# Patient Record
Sex: Female | Born: 1980 | Race: Black or African American | Hispanic: No | Marital: Single | Smoking: Never smoker
Health system: Southern US, Community
[De-identification: ages and names within clinical notes are randomized; demographics above are authoritative.]

## PROBLEM LIST (undated history)

## (undated) DIAGNOSIS — G4730 Sleep apnea, unspecified: Secondary | ICD-10-CM

## (undated) DIAGNOSIS — G43909 Migraine, unspecified, not intractable, without status migrainosus: Secondary | ICD-10-CM

## (undated) HISTORY — PX: OTHER SURGICAL HISTORY: SHX169

## (undated) HISTORY — PX: ROOT CANAL: SHX2363

## (undated) HISTORY — PX: WISDOM TOOTH EXTRACTION: SHX21

---

## 2001-03-06 ENCOUNTER — Emergency Department (HOSPITAL_COMMUNITY): Admission: EM | Admit: 2001-03-06 | Discharge: 2001-03-06 | Payer: Self-pay | Admitting: Emergency Medicine

## 2015-02-04 ENCOUNTER — Encounter: Payer: Self-pay | Admitting: Emergency Medicine

## 2015-02-04 ENCOUNTER — Emergency Department
Admission: EM | Admit: 2015-02-04 | Discharge: 2015-02-04 | Disposition: A | Discharge status: Home / Self Care | Payer: No Typology Code available for payment source | Attending: Emergency Medicine | Admitting: Emergency Medicine

## 2015-02-04 DIAGNOSIS — Y9389 Activity, other specified: Secondary | ICD-10-CM | POA: Diagnosis not present

## 2015-02-04 DIAGNOSIS — Y998 Other external cause status: Secondary | ICD-10-CM | POA: Insufficient documentation

## 2015-02-04 DIAGNOSIS — Z79899 Other long term (current) drug therapy: Secondary | ICD-10-CM | POA: Diagnosis not present

## 2015-02-04 DIAGNOSIS — S199XXA Unspecified injury of neck, initial encounter: Secondary | ICD-10-CM | POA: Insufficient documentation

## 2015-02-04 DIAGNOSIS — F419 Anxiety disorder, unspecified: Secondary | ICD-10-CM | POA: Insufficient documentation

## 2015-02-04 DIAGNOSIS — S299XXA Unspecified injury of thorax, initial encounter: Secondary | ICD-10-CM | POA: Diagnosis not present

## 2015-02-04 DIAGNOSIS — Y92410 Unspecified street and highway as the place of occurrence of the external cause: Secondary | ICD-10-CM | POA: Insufficient documentation

## 2015-02-04 HISTORY — DX: Sleep apnea, unspecified: G47.30

## 2015-02-04 HISTORY — DX: Migraine, unspecified, not intractable, without status migrainosus: G43.909

## 2015-02-04 MED ORDER — TRAMADOL HCL 50 MG PO TABS
ORAL_TABLET | ORAL | Status: AC
  Administered 2015-02-04: 100 mg via ORAL
  Filled 2015-02-04: qty 2

## 2015-02-04 MED ORDER — ACETAMINOPHEN 500 MG PO TABS: 1000.0000 mg | ORAL_TABLET | Freq: Once | ORAL | Status: DC

## 2015-02-04 MED ORDER — TRAMADOL HCL 50 MG PO TABS
100.0000 mg | ORAL_TABLET | Freq: Once | ORAL | Status: AC
  Administered 2015-02-04: 100 mg via ORAL

## 2015-02-04 MED ORDER — TRAMADOL HCL 50 MG PO TABS: 50.0000 mg | ORAL_TABLET | Freq: Two times a day (BID) | ORAL | Status: DC

## 2015-02-04 MED ORDER — ACETAMINOPHEN 500 MG PO TABS
ORAL_TABLET | ORAL | Status: AC
  Filled 2015-02-04: qty 2

## 2015-02-04 MED ORDER — ONDANSETRON 4 MG PO TBDP
4.0000 mg | ORAL_TABLET | Freq: Once | ORAL | Status: AC
  Administered 2015-02-04: 4 mg via ORAL

## 2015-02-04 MED ORDER — ONDANSETRON 4 MG PO TBDP
ORAL_TABLET | ORAL | Status: AC
  Administered 2015-02-04: 4 mg via ORAL
  Filled 2015-02-04: qty 4
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
## 2015-02-27 ENCOUNTER — Encounter: Payer: Self-pay | Admitting: *Deleted

## 2015-02-27 ENCOUNTER — Emergency Department: Payer: No Typology Code available for payment source

## 2015-02-27 ENCOUNTER — Emergency Department
Admission: EM | Admit: 2015-02-27 | Discharge: 2015-02-27 | Disposition: A | Discharge status: Home / Self Care | Payer: No Typology Code available for payment source | Attending: Student | Admitting: Student

## 2015-02-27 DIAGNOSIS — Y9389 Activity, other specified: Secondary | ICD-10-CM | POA: Insufficient documentation

## 2015-02-27 DIAGNOSIS — Y92410 Unspecified street and highway as the place of occurrence of the external cause: Secondary | ICD-10-CM | POA: Insufficient documentation

## 2015-02-27 DIAGNOSIS — S299XXA Unspecified injury of thorax, initial encounter: Secondary | ICD-10-CM | POA: Diagnosis not present

## 2015-02-27 DIAGNOSIS — S3992XA Unspecified injury of lower back, initial encounter: Secondary | ICD-10-CM | POA: Insufficient documentation

## 2015-02-27 DIAGNOSIS — Y998 Other external cause status: Secondary | ICD-10-CM | POA: Diagnosis not present

## 2015-02-27 DIAGNOSIS — S0990XA Unspecified injury of head, initial encounter: Secondary | ICD-10-CM | POA: Diagnosis not present

## 2015-02-27 DIAGNOSIS — T148XXA Other injury of unspecified body region, initial encounter: Secondary | ICD-10-CM

## 2015-02-27 DIAGNOSIS — Z3202 Encounter for pregnancy test, result negative: Secondary | ICD-10-CM | POA: Insufficient documentation

## 2015-02-27 DIAGNOSIS — Z79899 Other long term (current) drug therapy: Secondary | ICD-10-CM | POA: Diagnosis not present

## 2015-02-27 DIAGNOSIS — S199XXA Unspecified injury of neck, initial encounter: Secondary | ICD-10-CM | POA: Diagnosis present

## 2015-02-27 LAB — POCT PREGNANCY, URINE: PREG TEST UR: NEGATIVE

## 2015-02-27 MED ORDER — ETODOLAC 400 MG PO TABS: 400.0000 mg | ORAL_TABLET | Freq: Two times a day (BID) | ORAL | Status: DC

## 2015-02-27 MED ORDER — CYCLOBENZAPRINE HCL 5 MG PO TABS: 5.0000 mg | ORAL_TABLET | Freq: Three times a day (TID) | ORAL | Status: DC | PRN

## 2015-02-27 MED ORDER — KETOROLAC TROMETHAMINE 60 MG/2ML IM SOLN
60.0000 mg | Freq: Once | INTRAMUSCULAR | Status: AC
  Administered 2015-02-27: 60 mg via INTRAMUSCULAR
  Filled 2015-02-27: qty 2

## 2015-02-27 MED ORDER — PROMETHAZINE HCL 25 MG/ML IJ SOLN
25.0000 mg | Freq: Once | INTRAMUSCULAR | Status: AC
  Administered 2015-02-27: 25 mg via INTRAMUSCULAR
  Filled 2015-02-27: qty 1
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
## 2015-05-03 ENCOUNTER — Emergency Department
Admission: EM | Admit: 2015-05-03 | Discharge: 2015-05-03 | Disposition: A | Discharge status: Home / Self Care | Payer: 59 | Attending: Emergency Medicine | Admitting: Emergency Medicine

## 2015-05-03 ENCOUNTER — Encounter: Payer: Self-pay | Admitting: Emergency Medicine

## 2015-05-03 DIAGNOSIS — X58XXXA Exposure to other specified factors, initial encounter: Secondary | ICD-10-CM | POA: Insufficient documentation

## 2015-05-03 DIAGNOSIS — Y998 Other external cause status: Secondary | ICD-10-CM | POA: Diagnosis not present

## 2015-05-03 DIAGNOSIS — Z791 Long term (current) use of non-steroidal anti-inflammatories (NSAID): Secondary | ICD-10-CM | POA: Insufficient documentation

## 2015-05-03 DIAGNOSIS — T7840XA Allergy, unspecified, initial encounter: Secondary | ICD-10-CM | POA: Diagnosis not present

## 2015-05-03 DIAGNOSIS — Y9289 Other specified places as the place of occurrence of the external cause: Secondary | ICD-10-CM | POA: Insufficient documentation

## 2015-05-03 DIAGNOSIS — Z79899 Other long term (current) drug therapy: Secondary | ICD-10-CM | POA: Insufficient documentation

## 2015-05-03 DIAGNOSIS — Y9389 Activity, other specified: Secondary | ICD-10-CM | POA: Insufficient documentation

## 2015-05-03 MED ORDER — HYDROXYZINE HCL 25 MG PO TABS
ORAL_TABLET | ORAL | Status: AC
  Administered 2015-05-03: 25 mg via ORAL
  Filled 2015-05-03: qty 1

## 2015-05-03 MED ORDER — FAMOTIDINE IN NACL 20-0.9 MG/50ML-% IV SOLN
20.0000 mg | Freq: Once | INTRAVENOUS | Status: AC
  Administered 2015-05-03: 20 mg via INTRAVENOUS
  Filled 2015-05-03: qty 50

## 2015-05-03 MED ORDER — METHYLPREDNISOLONE SODIUM SUCC 125 MG IJ SOLR
125.0000 mg | Freq: Once | INTRAMUSCULAR | Status: AC
  Administered 2015-05-03: 125 mg via INTRAVENOUS
  Filled 2015-05-03: qty 2

## 2015-05-03 MED ORDER — HYDROXYZINE HCL 25 MG PO TABS
25.0000 mg | ORAL_TABLET | Freq: Once | ORAL | Status: AC
  Administered 2015-05-03: 25 mg via ORAL

## 2015-05-03 MED ORDER — DIPHENHYDRAMINE HCL 50 MG/ML IJ SOLN
50.0000 mg | Freq: Once | INTRAMUSCULAR | Status: AC
  Administered 2015-05-03: 50 mg via INTRAVENOUS
  Filled 2015-05-03: qty 1

## 2015-05-03 MED ORDER — HYDROXYZINE HCL 25 MG PO TABS: 25.0000 mg | ORAL_TABLET | Freq: Three times a day (TID) | ORAL | Status: DC | PRN

## 2015-05-03 MED ORDER — SODIUM CHLORIDE 0.9 % IV BOLUS (SEPSIS)
1000.0000 mL | Freq: Once | INTRAVENOUS | Status: AC
Start: 2015-05-03 — End: 2015-05-03
  Administered 2015-05-03: 1000 mL via INTRAVENOUS
  Filled 2015-05-03: qty 1000

## 2015-05-03 MED ORDER — IPRATROPIUM-ALBUTEROL 0.5-2.5 (3) MG/3ML IN SOLN
3.0000 mL | Freq: Once | RESPIRATORY_TRACT | Status: AC
  Administered 2015-05-03: 3 mL via RESPIRATORY_TRACT
  Filled 2015-05-03: qty 3

## 2015-05-03 MED ORDER — PREDNISONE 20 MG PO TABS: 40.0000 mg | ORAL_TABLET | Freq: Every day | ORAL | Status: DC

## 2015-05-03 NOTE — ED Notes (Signed)
No swelling noted to oropharnyx. No dermatological rash noted. VSS. NAD noted.

## 2015-05-03 NOTE — Discharge Instructions (Signed)
Allergies  Allergies may happen from anything your body is sensitive to. This may be food, medicines, pollens, chemicals, and many other things. Food allergies can be severe and deadly.  HOME CARE  If you do not know what causes a reaction, keep a diary. Write down the foods you ate and the symptoms that followed. Avoid foods that cause reactions.  If you have red raised spots (hives) or a rash:  Take medicine as told by your doctor.  Use medicines for red raised spots and itching as needed.  Apply cold cloths (compresses) to the skin. Take a cool bath. Avoid hot baths or showers.  If you are severely allergic:  It is often necessary to go to the hospital after you have treated your reaction.  Wear your medical alert jewelry.  You and your family must learn how to give a allergy shot or use an allergy kit (anaphylaxis kit).  Always carry your allergy kit or shot with you. Use this medicine as told by your doctor if a severe reaction is occurring. GET HELP RIGHT AWAY IF:  You have trouble breathing or are making high-pitched whistling sounds (wheezing).  You have a tight feeling in your chest or throat.  You have a puffy (swollen) mouth.  You have red raised spots, puffiness (swelling), or itching all over your body.  You have had a severe reaction that was helped by your allergy kit or shot. The reaction can return once the medicine has worn off.  You think you are having a food allergy. Symptoms most often happen within 30 minutes of eating a food.  Your symptoms have not gone away within 2 days or are getting worse.  You have new symptoms.  You want to retest yourself with a food or drink you think causes an allergic reaction. Only do this under the care of a doctor. MAKE SURE YOU:   Understand these instructions.  Will watch your condition.  Will get help right away if you are not doing well or get worse. Document Released: 11/30/2012 Document Reviewed:  11/30/2012 [facility] Patient Information 2015 [place]. This information is not intended to replace advice given to you by your health care provider. Make sure you discuss any questions you have with your health care provider.
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
## 2015-05-03 NOTE — ED Provider Notes (Signed)
[facility] Emergency Department Provider Note  Time seen: 12:30 PM  I have reviewed the triage vital signs and the nursing notes.   HISTORY  Chief Complaint Allergic Reaction    HPI Kelli Mayer is a 34 y.o. female with a past medical history of migraines and sleep apnea presents the emergency department a possible allergic reaction. According to the patient she was at the beach over the weekend, returned on Monday, beginning Monday evening she began with diffuse itching. Denies any known exposures, but states it was a new environment so she might of been exposed to something she is allergic to. Last night she said the itching worsened and she felt some discomfort in her throat so she took Benadryl at 7 PM, and again at 11 PM. She slept overnight, but this morning she felt that the itching was worse as well as some tongue swelling so she came to the emergency department for evaluation. 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 Currently the patient states some tongue swelling and itching diffusely, but is in no distress, speaking without difficulty, and swallowing secretions without any issues.     Past Medical History  Diagnosis Date  . Migraines   . Sleep apnea     There are no active problems to display for this patient.   History reviewed. No pertinent past surgical history.  Current Outpatient Rx  Name  Route  Sig  Dispense  Refill  . cyclobenzaprine (FLEXERIL) 5 MG tablet   Oral   Take 1 tablet (5 mg total) by mouth every 8 (eight) hours as needed for muscle spasms.   30 tablet   0   . EPINEPHrine 0.3 mg/0.3 mL IJ SOAJ injection   Intramuscular   Inject into the muscle once.         . etodolac (LODINE) 400 MG tablet   Oral   Take 1 tablet (400 mg total) by mouth 2 (two) times daily.   60 tablet   0   . traMADol (ULTRAM) 50 MG tablet   Oral   Take 1  tablet (50 mg total) by mouth 2 (two) times daily.   10 tablet   0     Allergies Coconut fatty acids; Ibuprofen; Iron; Naproxen; and Percocet  History reviewed. No pertinent family history.  Social History Social History  Substance Use Topics  . Smoking status: Never Smoker   . Smokeless tobacco: Never Used  . Alcohol Use: Yes     Comment: occassionally    Review of Systems Constitutional: Negative for fever. ENT: Mild tongue swelling per patient. Cardiovascular: Negative for chest pain. Respiratory: Negative for shortness of breath. Gastrointestinal: Negative for abdominal pain. Denies nausea or vomiting. Skin: States some bumps diffusely especially of her extremities and torso. Neurological: Negative for headache 10-point ROS otherwise negative.  ____________________________________________   PHYSICAL EXAM:  VITAL SIGNS: ED Triage Vitals  Enc Vitals Group     BP 05/03/15 1157 131/74 mmHg     Pulse Rate 05/03/15 1157 102     Resp 05/03/15 1157 18     Temp 05/03/15 1157 98.4 F (36.9 C)     Temp Source 05/03/15 1157 Oral     SpO2 05/03/15 1157 100 %     Weight 05/03/15 1157 264 lb (119.75 kg)     Height 05/03/15 1157 5\' 2"  (1.575 m)     Head Cir --  Peak Flow --      Pain Score 05/03/15 1157 0     Pain Loc --      Pain Edu? --      Excl. in [place]? --     Constitutional: Alert and oriented. Well appearing and in no distress. Eyes: Normal exam ENT   Mouth/Throat: Mucous membranes are moist. No edema of the tongue or pharynx identified. Cardiovascular: Normal rate, regular rhythm. No murmur Respiratory: Normal respiratory effort without tachypnea nor retractions. Breath sounds are clear. No wheezes on exam. Gastrointestinal: Soft and nontender. No distention.   Musculoskeletal: Nontender with normal range of motion in all extremities.  Neurologic:  Normal speech and language. No gross focal neurologic deficits are appreciated. Speech is normal. Skin:   Skin is warm, dry. Patient does have mild hives to lower extremity, and 1 on her face. Psychiatric: Mood and affect are normal. Speech and behavior are normal.   ____________________________________________    INITIAL IMPRESSION / ASSESSMENT AND PLAN / ED COURSE  Pertinent labs & imaging results that were available during my care of the patient were reviewed by me and considered in my medical decision making (see chart for details).  Patient with possible allergic reaction. No oral pharyngeal swelling identified. Patient speaking and swallowing without difficulty. We'll dose steroids, Benadryl, Pepcid, IV fluids, and closely monitor in the emergency department.  Patient states she feels much better, denies any throat or oral complaints. She is asking to be discharged home. She states she continues have some mild itching we will try Atarax, discharge from prednisone with a prescription for Atarax as well. Patient agreeable to plan. Discussed very strict allergic return precautions. Patient agreeable.  ____________________________________________   FINAL CLINICAL IMPRESSION(S) / ED DIAGNOSES  Allergic reaction   Harvest Dark, MD 05/03/15 1515

## 2015-06-27 ENCOUNTER — Ambulatory Visit: Payer: Self-pay | Admitting: Family Medicine

## 2015-11-21 ENCOUNTER — Encounter (HOSPITAL_COMMUNITY): Payer: Self-pay | Admitting: *Deleted

## 2015-11-21 ENCOUNTER — Inpatient Hospital Stay (HOSPITAL_COMMUNITY)
Admission: AD | Admit: 2015-11-21 | Discharge: 2015-11-21 | Disposition: A | Discharge status: Home / Self Care | Payer: BLUE CROSS/BLUE SHIELD | Source: Ambulatory Visit | Attending: Obstetrics and Gynecology | Admitting: Obstetrics and Gynecology

## 2015-11-21 DIAGNOSIS — Z885 Allergy status to narcotic agent status: Secondary | ICD-10-CM | POA: Insufficient documentation

## 2015-11-21 DIAGNOSIS — Z886 Allergy status to analgesic agent status: Secondary | ICD-10-CM | POA: Insufficient documentation

## 2015-11-21 DIAGNOSIS — Z91048 Other nonmedicinal substance allergy status: Secondary | ICD-10-CM | POA: Insufficient documentation

## 2015-11-21 DIAGNOSIS — D500 Iron deficiency anemia secondary to blood loss (chronic): Secondary | ICD-10-CM | POA: Diagnosis not present

## 2015-11-21 DIAGNOSIS — Z79899 Other long term (current) drug therapy: Secondary | ICD-10-CM | POA: Insufficient documentation

## 2015-11-21 DIAGNOSIS — N920 Excessive and frequent menstruation with regular cycle: Secondary | ICD-10-CM | POA: Diagnosis not present

## 2015-11-21 DIAGNOSIS — Z975 Presence of (intrauterine) contraceptive device: Secondary | ICD-10-CM | POA: Insufficient documentation

## 2015-11-21 DIAGNOSIS — R5383 Other fatigue: Secondary | ICD-10-CM | POA: Insufficient documentation

## 2015-11-21 DIAGNOSIS — D62 Acute posthemorrhagic anemia: Secondary | ICD-10-CM

## 2015-11-21 DIAGNOSIS — N939 Abnormal uterine and vaginal bleeding, unspecified: Secondary | ICD-10-CM | POA: Diagnosis present

## 2015-11-21 DIAGNOSIS — Z79891 Long term (current) use of opiate analgesic: Secondary | ICD-10-CM | POA: Diagnosis not present

## 2015-11-21 DIAGNOSIS — G4730 Sleep apnea, unspecified: Secondary | ICD-10-CM | POA: Insufficient documentation

## 2015-11-21 DIAGNOSIS — N924 Excessive bleeding in the premenopausal period: Secondary | ICD-10-CM

## 2015-11-21 DIAGNOSIS — Z7952 Long term (current) use of systemic steroids: Secondary | ICD-10-CM | POA: Insufficient documentation

## 2015-11-21 DIAGNOSIS — R42 Dizziness and giddiness: Secondary | ICD-10-CM | POA: Insufficient documentation

## 2015-11-21 LAB — CBC
HEMATOCRIT: 26.4 % — AB (ref 36.0–46.0)
Hemoglobin: 8.1 g/dL — ABNORMAL LOW (ref 12.0–15.0)
MCH: 23 pg — AB (ref 26.0–34.0)
MCHC: 30.7 g/dL (ref 30.0–36.0)
MCV: 75 fL — ABNORMAL LOW (ref 78.0–100.0)
PLATELETS: 310 10*3/uL (ref 150–400)
RBC: 3.52 MIL/uL — ABNORMAL LOW (ref 3.87–5.11)
RDW: 19.3 % — AB (ref 11.5–15.5)
WBC: 5.6 10*3/uL (ref 4.0–10.5)

## 2015-11-21 LAB — TYPE AND SCREEN
ABO/RH(D): O POS
Antibody Screen: NEGATIVE

## 2015-11-21 LAB — HCG, SERUM, QUALITATIVE: Preg, Serum: NEGATIVE

## 2015-11-21 LAB — FERRITIN: Ferritin: 5 ng/mL — ABNORMAL LOW (ref 11–307)

## 2015-11-21 LAB — ABO/RH: ABO/RH(D): O POS

## 2015-11-21 MED ORDER — SODIUM CHLORIDE 0.9 % IV SOLN
510.0000 mg | Freq: Once | INTRAVENOUS | Status: AC
  Administered 2015-11-21: 510 mg via INTRAVENOUS
  Filled 2015-11-21: qty 17

## 2015-11-21 MED ORDER — NORETHINDRONE ACETATE 5 MG PO TABS: 5.0000 mg | ORAL_TABLET | Freq: Every day | ORAL | Status: AC

## 2015-11-21 MED ORDER — SODIUM CHLORIDE 0.9 % IV SOLN
INTRAVENOUS | Status: DC
  Administered 2015-11-21: 20:00:00 via INTRAVENOUS

## 2015-11-21 MED ORDER — DIPHENHYDRAMINE HCL 50 MG/ML IJ SOLN
25.0000 mg | Freq: Once | INTRAMUSCULAR | Status: AC
  Administered 2015-11-21: 25 mg via INTRAVENOUS
  Filled 2015-11-21: qty 1

## 2015-11-21 NOTE — MAU Note (Signed)
Patient sent from office for evaluation of heavy bleeding.

## 2015-11-21 NOTE — MAU Note (Signed)
Pt tolerated IV Fereheme well. C/O some itching on the roof of her mouth. Pt refused further V/S pulse ox between 96-99% on R/A

## 2015-11-21 NOTE — MAU Provider Note (Signed)
Chief Complaint:  Vaginal Bleeding   First Provider Initiated Contact with Patient 11/21/15 1846     HPI  Kelli Mayer is a 35 y.o. G3P0030 who presents to maternity admissions reporting vaginal bleeding with symptomatic anemia.  Had a mirena placed today but was feeling weak and fingerstick Hgb was 7.  She was sent here for repeat CBC and possible transfusion. She reports vaginal bleeding, no vaginal itching/burning, urinary symptoms, h/a, dizziness, n/v, or fever/chills.    Dr Nena Alexander Note: Kelli Mayer is an 35 y.o. female G70P0030 Non-pregnant female presenting for severe anemia from ongoing issue with menorrhagia. Pt has had heavy menses for years and was on OCP's until last Fall then d/c'd because did not like how she felt on them. Since then menses have become increasingly worse, q month, x3-8d, heavy, crampy with clots. At her annual exam 10/27/15 was noted to have a hemoglobin of 9.2 on CBC. An US showed possible adenomyosis. She was presented with options and elected to try a Mirena to manage the bleeding. She presented today and had Mirena placed in the office. 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 Hgb on FS was 7.2 and bleeding noted to be moderate. She denies any recent sexual activity, boyfriend lives in [place]. STD screening done in office 10/27/15 and negative. She was sent to hospital for formal CBC and possible transfusion given symptomatic anemia and bleeding.   Past Medical History: Past Medical History  Diagnosis Date  . Migraines   . Sleep apnea     Past obstetric history: OB History  Gravida Para Term Preterm AB SAB TAB Ectopic Multiple Living  3 0   3 3    0    # Outcome Date GA Lbr Len/2nd Weight Sex Delivery Anes PTL Lv  3 SAB           2 SAB           1 SAB               Past Surgical History: Past Surgical History  Procedure  Laterality Date  . Laprascopic    . Root canal    . Wisdom tooth extraction      Family History: History reviewed. No pertinent family history.  Social History: Social History  Substance Use Topics  . Smoking status: Never Smoker   . Smokeless tobacco: Never Used  . Alcohol Use: Yes     Comment: occassionally    Allergies:  Allergies  Allergen Reactions  . Coconut Fatty Acids Anaphylaxis  . Ibuprofen Hives and Swelling    Swelling is of the tongue.  . Iron Hives, Itching and Swelling    Swelling is of the tongue, itching occurs in face and mouth. Has not had Feraheme  . Naproxen Hives and Other (See Comments)    Causes blisters.  . Oxycodone Itching and Rash    Meds:  Prescriptions prior to admission  Medication Sig Dispense Refill Last Dose  . albuterol (PROVENTIL HFA;VENTOLIN HFA) 108 (90 Base) MCG/ACT inhaler Inhale 2 puffs into the lungs every 4 (four) hours as needed for wheezing or shortness of breath.   Past Week at Unknown time  . chlorhexidine (PERIDEX) 0.12 % solution 15 mLs by Mouth Rinse route 2 (two) times daily.  1 11/21/2015 at Unknown time  . clonazePAM (KLONOPIN) 0.5 MG tablet Take 0.5 mg by mouth at bedtime as needed (For sleep.).  1 Past Month at Unknown time  . Cyanocobalamin (B-12 PO) Place 1 mL under the tongue as needed (For energy.).   Past Week at Unknown time  . fexofenadine-pseudoephedrine (ALLEGRA-D) 60-120 MG 12 hr tablet Take 1 tablet by mouth 2 (two) times daily as needed (For allergies.).   11/20/2015 at Unknown time  . ipratropium-albuterol (DUONEB) 0.5-2.5 (3) MG/3ML SOLN Take 3 mLs by nebulization every 4 (four) hours as needed (For asthma.).   Past Week at Unknown time  . Ketotifen Fumarate (ALLERGY EYE DROPS OP) Place 2 drops into both eyes 4 (four) times daily as needed (For allergies.).   Past Week at Unknown time  . levonorgestrel (MIRENA) 20 MCG/24HR IUD 1 each by Intrauterine route once. Placed 11/21/15.   Continuous  . Multiple  Vitamins-Minerals (EMERGEN-C IMMUNE PO) Take 1 packet by mouth 2 (two) times a week.   Past Week at Unknown time  . EPINEPHrine 0.3 mg/0.3 mL IJ SOAJ injection Inject into the muscle once.   Emergency    I have reviewed patient's Past Medical Hx, Surgical Hx, Family Hx, Social Hx, medications and allergies.  ROS:  Review of Systems  Constitutional: Positive for fatigue. Negative for fever and chills.  Respiratory: Negative for shortness of breath.   Gastrointestinal: Negative for nausea, vomiting, abdominal pain, diarrhea and constipation.  Genitourinary: Positive for vaginal bleeding. Negative for vaginal discharge, difficulty urinating and pelvic pain.  Musculoskeletal: Negative for back pain.  Neurological: Positive for dizziness.   Other systems negative  Physical Exam  Patient Vitals for the past 24 hrs:  BP Temp Temp src Pulse Resp  11/21/15 1831 143/90 mmHg 98.2 F (36.8 C) Oral 71 16   Constitutional: Well-developed, well-nourished female in no acute distress.  Cardiovascular: normal rate and rhythm, no ectopy audible, S1 & S2 heard, no murmur Respiratory: normal effort, no distress. Lungs CTAB with no wheezes or crackles GI: Abd soft, non-tender.  Nondistended.  No rebound, No guarding.  Bowel Sounds audible  MS: Extremities nontender, no edema, normal ROM Neurologic: Alert and oriented x 4.   Grossly nonfocal. GU: Neg CVAT. Skin:  Warm and Dry Psych:  Affect appropriate.  Pelvic exam deferred due to recent one done today   Labs: Results for orders placed or performed during the hospital encounter of 11/21/15 (from the past 24 hour(s))  CBC     Status: Abnormal   Collection Time: 11/21/15  6:23 PM  Result Value Ref Range   WBC 5.6 4.0 - 10.5 K/uL   RBC 3.52 (L) 3.87 - 5.11 MIL/uL   Hemoglobin 8.1 (L) 12.0 - 15.0 g/dL   HCT 26.4 (L) 36.0 - 46.0 %   MCV 75.0 (L) 78.0 - 100.0 fL   MCH 23.0 (L) 26.0 - 34.0 pg   MCHC 30.7 30.0 - 36.0 g/dL   RDW 19.3 (H) 11.5 - 15.5  %   Platelets 310 150 - 400 K/uL  Type and screen [place]     Status: None   Collection Time: 11/21/15  6:23 PM  Result Value Ref Range   ABO/RH(D) O POS    Antibody Screen NEG    Sample Expiration 11/24/2015   hCG, serum, qualitative     Status: None   Collection Time: 11/21/15  6:23 PM  Result Value Ref Range   Preg, Serum NEGATIVE NEGATIVE   --/--/O POS (04/04 1823)  Imaging:  No results found.  MAU Course/MDM: I have ordered labs as follows:  CBC Imaging ordered: none  Results reviewed.   Consult Dr Marvel Plan.   Treatments in MAU included Fereheme infusion with Benadryl premedication..   Discussed hemoglobin level was better than expected. Offered transfusion but explained that Dr Marvel Plan felt it would be fine to just give [facility].  Patient agrees  Assessment: Menorrhagia Anemia, blood loss  Plan: Discharge home Recommend iron rich foods, po hydration Rx sent for Aygestin for control of bleeding  (5mg   Daily x 30 days) Follow up in office in 2 weeks     Medication List    TAKE these medications        norethindrone 5 MG tablet  Commonly known as:  AYGESTIN  Take 1 tablet (5 mg total) by mouth daily.      ASK your doctor about these medications        albuterol 108 (90 Base) MCG/ACT inhaler  Commonly known as:  PROVENTIL HFA;VENTOLIN HFA  Inhale 2 puffs into the lungs every 4 (four) hours as needed for wheezing or shortness of breath.     ALLERGY EYE DROPS OP  Place 2 drops into both eyes 4 (four) times daily as needed (For allergies.).     B-12 PO  Place 1 mL under the tongue as needed (For energy.).     chlorhexidine 0.12 % solution  Commonly known as:  PERIDEX  15 mLs by Mouth Rinse route 2 (two) times daily.     clonazePAM 0.5 MG tablet  Commonly known as:  KLONOPIN  Take 0.5 mg by mouth at bedtime as needed (For sleep.).     EMERGEN-C IMMUNE PO  Take 1 packet by mouth 2 (two) times a week.     EPINEPHrine 0.3  mg/0.3 mL Soaj injection  Commonly known as:  EPI-PEN  Inject into the muscle once.     fexofenadine-pseudoephedrine 60-120 MG 12 hr tablet  Commonly known as:  ALLEGRA-D  Take 1 tablet by mouth 2 (two) times daily as needed (For allergies.).     ipratropium-albuterol 0.5-2.5 (3) MG/3ML Soln  Commonly known as:  DUONEB  Take 3 mLs by nebulization every 4 (four) hours as needed (For asthma.).     levonorgestrel 20 MCG/24HR IUD  Commonly known as:  MIRENA  1 each by Intrauterine route once. Placed 11/21/15.       Pt stable at time of discharge.  Encouraged to return here or to other Urgent Care/ED if she develops worsening of symptoms, increase in pain, fever, or other concerning symptoms.   Hansel Feinstein CNM, MSN Certified Nurse-Midwife 11/21/2015 7:37 PM

## 2015-11-21 NOTE — Discharge Instructions (Signed)

## 2015-11-21 NOTE — H&P (Signed)
Kelli Mayer is an 35 y.o. female G27P0030  Non-pregnant female presenting for severe anemia from ongoing issue with menorrhagia.  Pt has had heavy menses for years and was on OCP's until last Fall then d/c'd because did not like how she felt on them.  Since then menses have become increasingly worse, q month, x3-8d, heavy, crampy with clots.  At her annual exam 10/27/15 was noted to have a hemoglobin of 9.2 on CBC.  An US showed possible adenomyosis. She was presented with options and elected to try a Mirena to manage the bleeding.  She presented today and had Mirena placed in the office.  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  Hgb on FS was 7.2 and bleeding noted to be moderate.  She denies any recent sexual activity, boyfriend lives in [place].  STD screening done in office 10/27/15 and negative.  She was sent to hospital for formal CBC and possible transfusion given symptomatic anemia and bleeding.   Pertinent Gynecological History: Menses: q month with excessive flow and clotting  OB History: SAB x 2 EAB x1   Menstrual History: LMP current    Past Medical History  Diagnosis Date  . Migraines   . Sleep apnea     No past surgical history on file.  No family history on file.  Social History:  reports that she has never smoked. She has never used smokeless tobacco. She reports that she drinks alcohol. She reports that she does not use illicit drugs.  Allergies:  Allergies  Allergen Reactions  . Coconut Fatty Acids   . Ibuprofen   . Iron   . Naproxen   . Percocet [Oxycodone-Acetaminophen]     Prescriptions prior to admission  Medication Sig Dispense Refill Last Dose  . cyclobenzaprine (FLEXERIL) 5 MG tablet Take 1 tablet (5 mg total) by mouth every 8 (eight) hours as needed for muscle spasms. 30 tablet 0   . EPINEPHrine 0.3 mg/0.3 mL IJ SOAJ injection Inject into the muscle  once.     . etodolac (LODINE) 400 MG tablet Take 1 tablet (400 mg total) by mouth 2 (two) times daily. 60 tablet 0   . hydrOXYzine (ATARAX/VISTARIL) 25 MG tablet Take 1 tablet (25 mg total) by mouth 3 (three) times daily as needed. 30 tablet 0   . predniSONE (DELTASONE) 20 MG tablet Take 2 tablets (40 mg total) by mouth daily. 10 tablet 0   . traMADol (ULTRAM) 50 MG tablet Take 1 tablet (50 mg total) by mouth 2 (two) times daily. 10 tablet 0     Review of Systems  Constitutional: Positive for malaise/fatigue. Negative for fever.  Gastrointestinal: Negative for abdominal pain.  Neurological: Positive for weakness.    There were no vitals taken for this visit. Physical Exam  Constitutional:  Appears fatigued  Cardiovascular: Normal rate and regular rhythm.   Respiratory: Effort normal.  GI: Soft.  Genitourinary: Vagina normal.  Uterus 9 weeks size IUD in place    No results found for this or any previous visit (from the past 24 hour(s)).  No results found.  Assessment/Plan: Will await results of CBC and if hemoglobin confirmed <8 will transfuse 2 units.  Pt has IUD in place to help with future bleeding, she does not want any procedure which would alter her fertility.  Logan Bores 11/21/2015, 6:03 PM

## 2016-12-04 ENCOUNTER — Ambulatory Visit: Payer: BLUE CROSS/BLUE SHIELD | Admitting: Obstetrics

## 2016-12-20 ENCOUNTER — Ambulatory Visit (INDEPENDENT_AMBULATORY_CARE_PROVIDER_SITE_OTHER): Payer: Managed Care, Other (non HMO) | Admitting: Obstetrics

## 2016-12-20 ENCOUNTER — Encounter: Payer: Self-pay | Admitting: Obstetrics

## 2016-12-20 VITALS — BP 130/94 | HR 80 | Ht 62.0 in | Wt 251.0 lb

## 2016-12-20 DIAGNOSIS — Z1151 Encounter for screening for human papillomavirus (HPV): Secondary | ICD-10-CM

## 2016-12-20 DIAGNOSIS — Z124 Encounter for screening for malignant neoplasm of cervix: Secondary | ICD-10-CM | POA: Diagnosis not present

## 2016-12-20 DIAGNOSIS — Z3202 Encounter for pregnancy test, result negative: Secondary | ICD-10-CM | POA: Diagnosis not present

## 2016-12-20 DIAGNOSIS — Z3169 Encounter for other general counseling and advice on procreation: Secondary | ICD-10-CM

## 2016-12-20 DIAGNOSIS — R110 Nausea: Secondary | ICD-10-CM

## 2016-12-20 DIAGNOSIS — T8332XA Displacement of intrauterine contraceptive device, initial encounter: Secondary | ICD-10-CM

## 2016-12-20 DIAGNOSIS — Z01419 Encounter for gynecological examination (general) (routine) without abnormal findings: Secondary | ICD-10-CM

## 2016-12-20 DIAGNOSIS — N939 Abnormal uterine and vaginal bleeding, unspecified: Secondary | ICD-10-CM

## 2016-12-20 LAB — POCT URINE PREGNANCY: Preg Test, Ur: NEGATIVE

## 2016-12-20 MED ORDER — PROMETHAZINE HCL 25 MG PO TABS: 25.0000 mg | ORAL_TABLET | Freq: Four times a day (QID) | ORAL | 1 refills | Status: AC | PRN

## 2016-12-20 MED ORDER — FOLIC ACID 1 MG PO TABS: 1.0000 mg | ORAL_TABLET | Freq: Every day | ORAL | 11 refills | Status: DC

## 2016-12-20 NOTE — Patient Instructions (Addendum)
Health Maintenance, Female Adopting a healthy lifestyle and getting preventive care can go a long way to promote health and wellness. Talk with your health care provider about what schedule of regular examinations is right for you. This is a good chance for you to check in with your provider about disease prevention and staying healthy. In between checkups, there are plenty of things you can do on your own. Experts have done a lot of research about which lifestyle changes and preventive measures are most likely to keep you healthy. Ask your health care provider for more information. Weight and diet Eat a healthy diet  Be sure to include plenty of vegetables, fruits, low-fat dairy products, and lean protein.  Do not eat a lot of foods high in solid fats, added sugars, or salt.  Get regular exercise. This is one of the most important things you can do for your health.  Most adults should exercise for at least 150 minutes each week. The exercise should increase your heart rate and make you sweat (moderate-intensity exercise).  Most adults should also do strengthening exercises at least twice a week. This is in addition to the moderate-intensity exercise. Maintain a healthy weight  Body mass index (BMI) is a measurement that can be used to identify possible weight problems. It estimates body fat based on height and weight. Your health care provider can help determine your BMI and help you achieve or maintain a healthy weight.  For females 76 years of age and older:  A BMI below 18.5 is considered underweight.  A BMI of 18.5 to 24.9 is normal.  A BMI of 25 to 29.9 is considered overweight.  A BMI of 30 and above is considered obese. Watch levels of cholesterol and blood lipids  You should start having your blood tested for lipids and cholesterol at 38 years of age, then have this test every 5 years.  You may need to have your cholesterol levels checked more often if:  Your lipid or  cholesterol levels are high.  You are older than 38 years of age.  You are at high risk for heart disease. Cancer screening Lung Cancer  Lung cancer screening is recommended for adults 64-42 years old who are at high risk for lung cancer because of a history of smoking.  A yearly low-dose CT scan of the lungs is recommended for people who:  Currently smoke.  Have quit within the past 15 years.  Have at least a 30-pack-year history of smoking. A pack year is smoking an average of one pack of cigarettes a day for 1 year.  Yearly screening should continue until it has been 15 years since you quit.  Yearly screening should stop if you develop a health problem that would prevent you from having lung cancer treatment. Breast Cancer  Practice breast self-awareness. This means understanding how your breasts normally appear and feel.  It also means doing regular breast self-exams. Let your health care provider know about any changes, no matter how small.  If you are in your 20s or 30s, you should have a clinical breast exam (CBE) by a health care provider every 1-3 years as part of a regular health exam.  If you are 34 or older, have a CBE every year. Also consider having a breast X-ray (mammogram) every year.  If you have a family history of breast cancer, talk to your health care provider about genetic screening.  If you are at high risk for breast cancer, talk  to your health care provider about having an MRI and a mammogram every year.  Breast cancer gene (BRCA) assessment is recommended for women who have family members with BRCA-related cancers. BRCA-related cancers include:  Breast.  Ovarian.  Tubal.  Peritoneal cancers.  Results of the assessment will determine the need for genetic counseling and BRCA1 and BRCA2 testing. Cervical Cancer  Your health care provider may recommend that you be screened regularly for cancer of the pelvic organs (ovaries, uterus, and vagina).  This screening involves a pelvic examination, including checking for microscopic changes to the surface of your cervix (Pap test). You may be encouraged to have this screening done every 3 years, beginning at age 24.  For women ages 66-65, health care providers may recommend pelvic exams and Pap testing every 3 years, or they may recommend the Pap and pelvic exam, combined with testing for human papilloma virus (HPV), every 5 years. Some types of HPV increase your risk of cervical cancer. Testing for HPV may also be done on women of any age with unclear Pap test results.  Other health care providers may not recommend any screening for nonpregnant women who are considered low risk for pelvic cancer and who do not have symptoms. Ask your health care provider if a screening pelvic exam is right for you.  If you have had past treatment for cervical cancer or a condition that could lead to cancer, you need Pap tests and screening for cancer for at least 20 years after your treatment. If Pap tests have been discontinued, your risk factors (such as having a new sexual partner) need to be reassessed to determine if screening should resume. Some women have medical problems that increase the chance of getting cervical cancer. In these cases, your health care provider may recommend more frequent screening and Pap tests. Colorectal Cancer  This type of cancer can be detected and often prevented.  Routine colorectal cancer screening usually begins at 38 years of age and continues through 38 years of age.  Your health care provider may recommend screening at an earlier age if you have risk factors for colon cancer.  Your health care provider may also recommend using home test kits to check for hidden blood in the stool.  A small camera at the end of a tube can be used to examine your colon directly (sigmoidoscopy or colonoscopy). This is done to check for the earliest forms of colorectal cancer.  Routine  screening usually begins at age 41.  Direct examination of the colon should be repeated every 5-10 years through 38 years of age. However, you may need to be screened more often if early forms of precancerous polyps or small growths are found. Skin Cancer  Check your skin from head to toe regularly.  Tell your health care provider about any new moles or changes in moles, especially if there is a change in a mole's shape or color.  Also tell your health care provider if you have a mole that is larger than the size of a pencil eraser.  Always use sunscreen. Apply sunscreen liberally and repeatedly throughout the day.  Protect yourself by wearing long sleeves, pants, a wide-brimmed hat, and sunglasses whenever you are outside. Heart disease, diabetes, and high blood pressure  High blood pressure causes heart disease and increases the risk of stroke. High blood pressure is more likely to develop in:  People who have blood pressure in the high end of the normal range (130-139/85-89 mm Hg).  People who are overweight or obese.  People who are African American.  If you are 59-24 years of age, have your blood pressure checked every 3-5 years. If you are 34 years of age or older, have your blood pressure checked every year. You should have your blood pressure measured twice-once when you are at a hospital or clinic, and once when you are not at a hospital or clinic. Record the average of the two measurements. To check your blood pressure when you are not at a hospital or clinic, you can use:  An automated blood pressure machine at a pharmacy.  A home blood pressure monitor.  If you are between 29 years and 60 years old, ask your health care provider if you should take aspirin to prevent strokes.  Have regular diabetes screenings. This involves taking a blood sample to check your fasting blood sugar level.  If you are at a normal weight and have a low risk for diabetes, have this test once  every three years after 38 years of age.  If you are overweight and have a high risk for diabetes, consider being tested at a younger age or more often. Preventing infection Hepatitis B  If you have a higher risk for hepatitis B, you should be screened for this virus. You are considered at high risk for hepatitis B if:  You were born in a country where hepatitis B is common. Ask your health care provider which countries are considered high risk.  Your parents were born in a high-risk country, and you have not been immunized against hepatitis B (hepatitis B vaccine).  You have HIV or AIDS.  You use needles to inject street drugs.  You live with someone who has hepatitis B.  You have had sex with someone who has hepatitis B.  You get hemodialysis treatment.  You take certain medicines for conditions, including cancer, organ transplantation, and autoimmune conditions. Hepatitis C  Blood testing is recommended for:  Everyone born from [birth year] through 1967.  Anyone with known risk factors for hepatitis C. Sexually transmitted infections (STIs)  You should be screened for sexually transmitted infections (STIs) including gonorrhea and chlamydia if:  You are sexually active and are younger than 38 years of age.  You are older than 38 years of age and your health care provider tells you that you are at risk for this type of infection.  Your sexual activity has changed since you were last screened and you are at an increased risk for chlamydia or gonorrhea. Ask your health care provider if you are at risk.  If you do not have HIV, but are at risk, it may be recommended that you take a prescription medicine daily to prevent HIV infection. This is called pre-exposure prophylaxis (PrEP). You are considered at risk if:  You are sexually active and do not regularly use condoms or know the HIV status of your partner(s).  You take drugs by injection.  You are sexually active with a partner  who has HIV. Talk with your health care provider about whether you are at high risk of being infected with HIV. If you choose to begin PrEP, you should first be tested for HIV. You should then be tested every 3 months for as long as you are taking PrEP. Pregnancy  If you are premenopausal and you may become pregnant, ask your health care provider about preconception counseling.  If you may become pregnant, take 400 to 800 micrograms (mcg) of folic acid  every day.  If you want to prevent pregnancy, talk to your health care provider about birth control (contraception). Osteoporosis and menopause  Osteoporosis is a disease in which the bones lose minerals and strength with aging. This can result in serious bone fractures. Your risk for osteoporosis can be identified using a bone density scan.  If you are 4 years of age or older, or if you are at risk for osteoporosis and fractures, ask your health care provider if you should be screened.  Ask your health care provider whether you should take a calcium or vitamin D supplement to lower your risk for osteoporosis.  Menopause may have certain physical symptoms and risks.  Hormone replacement therapy may reduce some of these symptoms and risks. Talk to your health care provider about whether hormone replacement therapy is right for you. Follow these instructions at home:  Schedule regular health, dental, and eye exams.  Stay current with your immunizations.  Do not use any tobacco products including cigarettes, chewing tobacco, or electronic cigarettes.  If you are pregnant, do not drink alcohol.  If you are breastfeeding, limit how much and how often you drink alcohol.  Limit alcohol intake to no more than 1 drink per day for nonpregnant women. One drink equals 12 ounces of beer, 5 ounces of wine, or 1 ounces of hard liquor.  Do not use street drugs.  Do not share needles.  Ask your health care provider for help if you need support  or information about quitting drugs.  Tell your health care provider if you often feel depressed.  Tell your health care provider if you have ever been abused or do not feel safe at home. This information is not intended to replace advice given to you by your health care provider. Make sure you discuss any questions you have with your health care provider. Document Released: 02/18/2011 Document Revised: 01/11/2016 Document Reviewed: 05/09/2015 Elsevier Interactive Patient Education  2017 [employer].

## 2016-12-20 NOTE — Progress Notes (Signed)
Subjective:        Kelli Mayer is a 36 y.o. female here for a routine exam.  Current complaints: Heavy periods.  Had a Mirena IUD but does not feel string.  Requests removal of IUD and try to conceive.  History of Migraine HA associated with severe N/V.  Personal health questionnaire:  Is patient Ashkenazi Jewish, have a family history of breast and/or ovarian cancer: no Is there a family history of uterine cancer diagnosed at age < 65, gastrointestinal cancer, urinary tract cancer, family member who is a Field seismologist syndrome-associated carrier: no Is the patient overweight and hypertensive, family history of diabetes, personal history of gestational diabetes, preeclampsia or PCOS: no Is patient over 3, have PCOS,  family history of premature CHD under age 22, diabetes, smoke, have hypertension or peripheral artery disease:  no At any time, has a partner hit, kicked or otherwise hurt or frightened you?: no Over the past 2 weeks, have you felt down, depressed or hopeless?: no Over the past 2 weeks, have you felt little interest or pleasure in doing things?:no   Gynecologic History Patient's last menstrual period was 12/13/2016. Contraception: IUD Last Pap: over a year. Results were: normal Last mammogram: n/a. Results were: n/a  Obstetric History OB History  Gravida Para Term Preterm AB Living  3 0     3 0  SAB TAB Ectopic Multiple Live Births  3            # Outcome Date GA Lbr Len/2nd Weight Sex Delivery Anes PTL Lv  3 SAB           2 SAB           1 SAB               Past Medical History:  Diagnosis Date  . Migraines   . Sleep apnea     Past Surgical History:  Procedure Laterality Date  . laprascopic    . ROOT CANAL    . WISDOM TOOTH EXTRACTION       Current Outpatient Prescriptions:  .  albuterol (PROVENTIL HFA;VENTOLIN HFA) 108 (90 Base) MCG/ACT inhaler, Inhale 2 puffs into the lungs every 4 (four) hours as needed for wheezing or shortness of breath., Disp: ,  Rfl:  .  EPINEPHrine 0.3 mg/0.3 mL IJ SOAJ injection, Inject into the muscle once., Disp: , Rfl:  .  chlorhexidine (PERIDEX) 0.12 % solution, 15 mLs by Mouth Rinse route 2 (two) times daily., Disp: , Rfl: 1 .  clonazePAM (KLONOPIN) 0.5 MG tablet, Take 0.5 mg by mouth at bedtime as needed (For sleep.). , Disp: , Rfl: 1 .  Cyanocobalamin (B-12 PO), Place 1 mL under the tongue as needed (For energy.)., Disp: , Rfl:  .  fexofenadine-pseudoephedrine (ALLEGRA-D) 60-120 MG 12 hr tablet, Take 1 tablet by mouth 2 (two) times daily as needed (For allergies.)., Disp: , Rfl:  .  folic acid (FOLVITE) 1 MG tablet, Take 1 tablet (1 mg total) by mouth daily., Disp: 30 tablet, Rfl: 11 .  ipratropium-albuterol (DUONEB) 0.5-2.5 (3) MG/3ML SOLN, Take 3 mLs by nebulization every 4 (four) hours as needed (For asthma.)., Disp: , Rfl:  .  Ketotifen Fumarate (ALLERGY EYE DROPS OP), Place 2 drops into both eyes 4 (four) times daily as needed (For allergies.)., Disp: , Rfl:  .  levonorgestrel (MIRENA) 20 MCG/24HR IUD, 1 each by Intrauterine route once. Placed 11/21/15., Disp: , Rfl:  .  Multiple Vitamins-Minerals (EMERGEN-C IMMUNE PO), Take  1 packet by mouth 2 (two) times a week., Disp: , Rfl:  .  norethindrone (AYGESTIN) 5 MG tablet, Take 1 tablet (5 mg total) by mouth daily. (Patient not taking: Reported on 12/20/2016), Disp: 30 tablet, Rfl: 0 .  promethazine (PHENERGAN) 25 MG tablet, Take 1 tablet (25 mg total) by mouth every 6 (six) hours as needed for nausea or vomiting., Disp: 30 tablet, Rfl: 1 Allergies  Allergen Reactions  . Coconut Fatty Acids Anaphylaxis  . Ibuprofen Hives and Swelling    Swelling is of the tongue.  . Iron Hives, Itching and Swelling    Swelling is of the tongue, itching occurs in face and mouth. Has not had Feraheme  . Latex Hives and Swelling  . Naproxen Hives and Other (See Comments)    Causes blisters.  . Oxycodone Itching and Rash    Social History  Substance Use Topics  . Smoking  status: Never Smoker  . Smokeless tobacco: Never Used  . Alcohol use Yes     Comment: occassionally    History reviewed. No pertinent family history.    Review of Systems  Constitutional: negative for fatigue and weight loss Respiratory: negative for cough and wheezing Cardiovascular: negative for chest pain, fatigue and palpitations Gastrointestinal: positive for N / V Musculoskeletal:negative for myalgias Neurological: positive for migraines Behavioral/Psych: positive for anxiety / situational depression Endocrine: negative for temperature intolerance    Genitourinary:positive for abnormal menstrual periods Integument/breast: negative for breast lump, breast tenderness, nipple discharge and skin lesion(s)    Objective:       BP (!) 130/94   Pulse 80   Ht 5\' 2"  (1.575 m)   Wt 251 lb (113.9 kg)   LMP 12/13/2016   BMI 45.91 kg/m  General:   alert  Skin:   no rash or abnormalities  Lungs:   clear to auscultation bilaterally  Heart:   regular rate and rhythm, S1, S2 normal, no murmur, click, rub or gallop  Breasts:   normal without suspicious masses, skin or nipple changes or axillary nodes  Abdomen:  normal findings: no organomegaly, soft, non-tender and no hernia  Pelvis:  External genitalia: normal general appearance Urinary system: urethral meatus normal and bladder without fullness, nontender Vaginal: normal without tenderness, induration or masses Cervix: normal appearance.  IUD string not visible Adnexa: normal bimanual exam Uterus: anteverted and non-tender, normal size   Lab Review Urine pregnancy test Labs reviewed yes Radiologic studies reviewed no  50% of 20 min visit spent on counseling and coordination of care.    Assessment:    Healthy female exam.    AUB - Hormonal  Severe Anemia  Lost IUD string  Migraines  Severe N/V  Habitual Aborter   Plan:   Will probably refer to Reproductive Endocrinologist for Habitual Aborter  Education reviewed:  calcium supplements, depression evaluation, low fat, low cholesterol diet, self breast exams and weight bearing exercise. Contraception: none. Follow up in: 6 weeks.   Meds ordered this encounter  Medications  . promethazine (PHENERGAN) 25 MG tablet    Sig: Take 1 tablet (25 mg total) by mouth every 6 (six) hours as needed for nausea or vomiting.    Dispense:  30 tablet    Refill:  1  . folic acid (FOLVITE) 1 MG tablet    Sig: Take 1 tablet (1 mg total) by mouth daily.    Dispense:  30 tablet    Refill:  11   Orders Placed This Encounter  Procedures  .  US Transvaginal Non-OB    Standing Status:   Future    Standing Expiration Date:   02/19/2018    Order Specific Question:   Reason for Exam (SYMPTOM  OR DIAGNOSIS REQUIRED)    Answer:   AUB.  IUD Placement.    Order Specific Question:   Preferred imaging location?    Answer:   [facility]  . US Pelvis Complete    Standing Status:   Future    Standing Expiration Date:   02/19/2018    Order Specific Question:   Reason for Exam (SYMPTOM  OR DIAGNOSIS REQUIRED)    Answer:   AUB.  IUD  Placement.    Order Specific Question:   Preferred imaging location?    Answer:   [place] urine pregnancy      Patient is in the office for GYN visit, she complains of migraine today. Patient states that her periods are regular, LMP 12-13-16, but they are extremely heavy. Patient states that she goes through 4 pads an hour with clots, complains of dizziness.

## 2016-12-23 LAB — CERVICOVAGINAL ANCILLARY ONLY
Bacterial vaginitis: POSITIVE — AB
Candida vaginitis: NEGATIVE

## 2016-12-24 ENCOUNTER — Other Ambulatory Visit: Payer: Self-pay | Admitting: Obstetrics

## 2016-12-24 DIAGNOSIS — B9689 Other specified bacterial agents as the cause of diseases classified elsewhere: Secondary | ICD-10-CM

## 2016-12-24 DIAGNOSIS — N760 Acute vaginitis: Principal | ICD-10-CM

## 2016-12-24 MED ORDER — METRONIDAZOLE 500 MG PO TABS: 500.0000 mg | ORAL_TABLET | Freq: Two times a day (BID) | ORAL | 2 refills | Status: DC

## 2016-12-25 LAB — CYTOLOGY - PAP
DIAGNOSIS: UNDETERMINED — AB
HPV (WINDOPATH): DETECTED — AB

## 2017-01-03 ENCOUNTER — Ambulatory Visit (HOSPITAL_COMMUNITY)
Admission: RE | Admit: 2017-01-03 | Discharge: 2017-01-03 | Disposition: A | Discharge status: Home / Self Care | Payer: Managed Care, Other (non HMO) | Source: Ambulatory Visit | Attending: Obstetrics | Admitting: Obstetrics

## 2017-01-03 DIAGNOSIS — N939 Abnormal uterine and vaginal bleeding, unspecified: Secondary | ICD-10-CM

## 2017-01-03 DIAGNOSIS — T8332XA Displacement of intrauterine contraceptive device, initial encounter: Secondary | ICD-10-CM

## 2017-01-03 DIAGNOSIS — N859 Noninflammatory disorder of uterus, unspecified: Secondary | ICD-10-CM | POA: Diagnosis not present

## 2017-01-08 ENCOUNTER — Telehealth: Payer: Self-pay

## 2017-01-08 ENCOUNTER — Other Ambulatory Visit: Payer: Self-pay | Admitting: Obstetrics

## 2017-01-08 NOTE — Telephone Encounter (Signed)
Pt called wanting to know the results of her U/S for IUD placement.

## 2017-01-10 ENCOUNTER — Other Ambulatory Visit: Payer: Self-pay | Admitting: Obstetrics

## 2017-01-10 DIAGNOSIS — N939 Abnormal uterine and vaginal bleeding, unspecified: Secondary | ICD-10-CM

## 2017-01-10 DIAGNOSIS — Z8742 Personal history of other diseases of the female genital tract: Secondary | ICD-10-CM

## 2017-01-10 DIAGNOSIS — Z8759 Personal history of other complications of pregnancy, childbirth and the puerperium: Secondary | ICD-10-CM

## 2017-01-10 DIAGNOSIS — D251 Intramural leiomyoma of uterus: Secondary | ICD-10-CM

## 2017-02-11 ENCOUNTER — Emergency Department (HOSPITAL_COMMUNITY): Payer: Managed Care, Other (non HMO)

## 2017-02-11 ENCOUNTER — Emergency Department (HOSPITAL_COMMUNITY)
Admission: EM | Admit: 2017-02-11 | Discharge: 2017-02-11 | Disposition: A | Discharge status: Home / Self Care | Payer: Managed Care, Other (non HMO) | Attending: Emergency Medicine | Admitting: Emergency Medicine

## 2017-02-11 ENCOUNTER — Encounter (HOSPITAL_COMMUNITY): Payer: Self-pay | Admitting: Emergency Medicine

## 2017-02-11 DIAGNOSIS — Y999 Unspecified external cause status: Secondary | ICD-10-CM | POA: Diagnosis not present

## 2017-02-11 DIAGNOSIS — Z23 Encounter for immunization: Secondary | ICD-10-CM | POA: Diagnosis not present

## 2017-02-11 DIAGNOSIS — S31159A Open bite of abdominal wall, unspecified quadrant without penetration into peritoneal cavity, initial encounter: Secondary | ICD-10-CM | POA: Insufficient documentation

## 2017-02-11 DIAGNOSIS — Z91040 Latex allergy status: Secondary | ICD-10-CM | POA: Diagnosis not present

## 2017-02-11 DIAGNOSIS — R930 Abnormal findings on diagnostic imaging of skull and head, not elsewhere classified: Secondary | ICD-10-CM | POA: Diagnosis not present

## 2017-02-11 DIAGNOSIS — Z203 Contact with and (suspected) exposure to rabies: Secondary | ICD-10-CM | POA: Diagnosis not present

## 2017-02-11 DIAGNOSIS — W540XXA Bitten by dog, initial encounter: Secondary | ICD-10-CM | POA: Diagnosis not present

## 2017-02-11 DIAGNOSIS — S81851A Open bite, right lower leg, initial encounter: Secondary | ICD-10-CM | POA: Insufficient documentation

## 2017-02-11 DIAGNOSIS — Y929 Unspecified place or not applicable: Secondary | ICD-10-CM | POA: Diagnosis not present

## 2017-02-11 DIAGNOSIS — Y939 Activity, unspecified: Secondary | ICD-10-CM | POA: Insufficient documentation

## 2017-02-11 DIAGNOSIS — S41152A Open bite of left upper arm, initial encounter: Secondary | ICD-10-CM | POA: Insufficient documentation

## 2017-02-11 DIAGNOSIS — Z79899 Other long term (current) drug therapy: Secondary | ICD-10-CM | POA: Insufficient documentation

## 2017-02-11 DIAGNOSIS — T148XXA Other injury of unspecified body region, initial encounter: Secondary | ICD-10-CM

## 2017-02-11 LAB — COMPREHENSIVE METABOLIC PANEL
ALBUMIN: 4.4 g/dL (ref 3.5–5.0)
ALK PHOS: 87 U/L (ref 38–126)
ALT: 32 U/L (ref 14–54)
ANION GAP: 10 (ref 5–15)
AST: 71 U/L — ABNORMAL HIGH (ref 15–41)
BUN: 8 mg/dL (ref 6–20)
CALCIUM: 8.9 mg/dL (ref 8.9–10.3)
CO2: 22 mmol/L (ref 22–32)
Chloride: 106 mmol/L (ref 101–111)
Creatinine, Ser: 1 mg/dL (ref 0.44–1.00)
GFR calc non Af Amer: >60 mL/min (ref >60)
Glucose, Bld: 119 mg/dL — ABNORMAL HIGH (ref 65–99)
POTASSIUM: 3.2 mmol/L — AB (ref 3.5–5.1)
SODIUM: 138 mmol/L (ref 135–145)
TOTAL PROTEIN: 7.4 g/dL (ref 6.5–8.1)
Total Bilirubin: 0.3 mg/dL (ref 0.3–1.2)

## 2017-02-11 LAB — CBC WITH DIFFERENTIAL/PLATELET
BASOS ABS: 0 10*3/uL (ref 0.0–0.1)
BASOS PCT: 0 %
Eosinophils Absolute: 0 10*3/uL (ref 0.0–0.7)
Eosinophils Relative: 0 %
HEMATOCRIT: 34.5 % — AB (ref 36.0–46.0)
HEMOGLOBIN: 11.3 g/dL — AB (ref 12.0–15.0)
LYMPHS PCT: 8 %
Lymphs Abs: 1.2 10*3/uL (ref 0.7–4.0)
MCH: 29.4 pg (ref 26.0–34.0)
MCHC: 32.8 g/dL (ref 30.0–36.0)
MCV: 89.8 fL (ref 78.0–100.0)
Monocytes Absolute: 0.9 10*3/uL (ref 0.1–1.0)
Monocytes Relative: 6 %
NEUTROS ABS: 12.5 10*3/uL — AB (ref 1.7–7.7)
NEUTROS PCT: 86 %
Platelets: 385 10*3/uL (ref 150–400)
RBC: 3.84 MIL/uL — ABNORMAL LOW (ref 3.87–5.11)
RDW: 14.4 % (ref 11.5–15.5)
WBC: 14.6 10*3/uL — ABNORMAL HIGH (ref 4.0–10.5)

## 2017-02-11 LAB — RAPID URINE DRUG SCREEN, HOSP PERFORMED
AMPHETAMINES: NOT DETECTED
BARBITURATES: NOT DETECTED
Benzodiazepines: NOT DETECTED
COCAINE: NOT DETECTED
Opiates: NOT DETECTED
Tetrahydrocannabinol: NOT DETECTED

## 2017-02-11 LAB — ETHANOL: ALCOHOL ETHYL (B): 118 mg/dL — AB (ref <5)

## 2017-02-11 LAB — I-STAT BETA HCG BLOOD, ED (MC, WL, AP ONLY): I-stat hCG, quantitative: 9.9 m[IU]/mL — ABNORMAL HIGH (ref <5)

## 2017-02-11 MED ORDER — AMOXICILLIN-POT CLAVULANATE 875-125 MG PO TABS: 1.0000 | ORAL_TABLET | Freq: Two times a day (BID) | ORAL | 0 refills | Status: DC

## 2017-02-11 MED ORDER — HYDROCODONE-ACETAMINOPHEN 5-325 MG PO TABS: 1.0000 | ORAL_TABLET | Freq: Four times a day (QID) | ORAL | 0 refills | Status: DC | PRN

## 2017-02-11 MED ORDER — RABIES IMMUNE GLOBULIN 150 UNIT/ML IM INJ
20.0000 [IU]/kg | INJECTION | Freq: Once | INTRAMUSCULAR | Status: AC
  Administered 2017-02-11: 2250 [IU]
  Filled 2017-02-11: qty 15

## 2017-02-11 MED ORDER — ONDANSETRON HCL 4 MG/2ML IJ SOLN
4.0000 mg | Freq: Once | INTRAMUSCULAR | Status: AC
  Administered 2017-02-11: 4 mg via INTRAVENOUS
  Filled 2017-02-11: qty 2

## 2017-02-11 MED ORDER — FENTANYL CITRATE (PF) 100 MCG/2ML IJ SOLN
50.0000 ug | Freq: Once | INTRAMUSCULAR | Status: AC
  Administered 2017-02-11: 50 ug via INTRAVENOUS

## 2017-02-11 MED ORDER — RABIES VACCINE, PCEC IM SUSR
1.0000 mL | Freq: Once | INTRAMUSCULAR | Status: AC
  Administered 2017-02-11: 1 mL via INTRAMUSCULAR
  Filled 2017-02-11: qty 1

## 2017-02-11 MED ORDER — BUPIVACAINE-EPINEPHRINE 0.25% -1:200000 IJ SOLN
50.0000 mL | Freq: Once | INTRAMUSCULAR | Status: AC
  Administered 2017-02-11: 50 mL
  Filled 2017-02-11: qty 50

## 2017-02-11 MED ORDER — FENTANYL CITRATE (PF) 100 MCG/2ML IJ SOLN
50.0000 ug | Freq: Once | INTRAMUSCULAR | Status: AC
  Administered 2017-02-11: 50 ug via INTRAVENOUS
  Filled 2017-02-11: qty 2
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
## 2017-02-11 NOTE — Discharge Instructions (Signed)

## 2017-02-11 NOTE — ED Notes (Signed)
Patient is increasingly forgetful and anxious. Patient is becoming more tachycardic and this nurse is concerned for patient neurological status. PA and MD notified. CT scan ordered.

## 2017-02-11 NOTE — ED Notes (Signed)
PA at bedside.

## 2017-02-11 NOTE — ED Triage Notes (Signed)
Pt bit by large dog today, multiple bites across body, fell and struck head. Deepest bites to left wrist and right calf, deep bite to left forearm, right leg, bilateral buttocks. Motor function and sensation intact. Pt also c/o SOB, chest pain, nausea, headache. Tetanus shots up to date. Rabies status of dog unknown. Pt became weak in triage and had episode of syncope where RN had to guide pt into chair.

## 2017-02-11 NOTE — ED Notes (Signed)
Patient wounds cleaned with sterile saline and dressed with wet to dry dressings. Patient transported to Xray.

## 2017-02-11 NOTE — ED Notes (Signed)
[employer] at patient bedside.

## 2017-02-11 NOTE — ED Notes (Signed)
Patient returned from xray. Complains of nausea and is dry heaving and watering at the mouth. Patient increasingly tachycardic and hypertensive. MD and PA made aware.

## 2017-02-11 NOTE — ED Notes (Signed)
Patient transported to CT 

## 2017-02-12 ENCOUNTER — Encounter (HOSPITAL_COMMUNITY): Payer: Self-pay | Admitting: Emergency Medicine

## 2017-02-12 ENCOUNTER — Emergency Department (HOSPITAL_COMMUNITY)
Admission: EM | Admit: 2017-02-12 | Discharge: 2017-02-13 | Disposition: A | Discharge status: Home / Self Care | Payer: Managed Care, Other (non HMO) | Attending: Emergency Medicine | Admitting: Emergency Medicine

## 2017-02-12 DIAGNOSIS — L089 Local infection of the skin and subcutaneous tissue, unspecified: Secondary | ICD-10-CM

## 2017-02-12 DIAGNOSIS — Z91040 Latex allergy status: Secondary | ICD-10-CM | POA: Diagnosis not present

## 2017-02-12 DIAGNOSIS — R079 Chest pain, unspecified: Secondary | ICD-10-CM | POA: Insufficient documentation

## 2017-02-12 DIAGNOSIS — T148XXA Other injury of unspecified body region, initial encounter: Secondary | ICD-10-CM

## 2017-02-12 DIAGNOSIS — R070 Pain in throat: Secondary | ICD-10-CM | POA: Diagnosis present

## 2017-02-12 DIAGNOSIS — Z79899 Other long term (current) drug therapy: Secondary | ICD-10-CM | POA: Diagnosis not present

## 2017-02-12 LAB — BASIC METABOLIC PANEL
Anion gap: 8 (ref 5–15)
CHLORIDE: 106 mmol/L (ref 101–111)
CO2: 24 mmol/L (ref 22–32)
CREATININE: 0.82 mg/dL (ref 0.44–1.00)
Calcium: 9 mg/dL (ref 8.9–10.3)
GFR calc Af Amer: >60 mL/min (ref >60)
GLUCOSE: 112 mg/dL — AB (ref 65–99)
Potassium: 3.2 mmol/L — ABNORMAL LOW (ref 3.5–5.1)
SODIUM: 138 mmol/L (ref 135–145)

## 2017-02-12 LAB — CBC
HCT: 33.4 % — ABNORMAL LOW (ref 36.0–46.0)
Hemoglobin: 10.5 g/dL — ABNORMAL LOW (ref 12.0–15.0)
MCH: 28.8 pg (ref 26.0–34.0)
MCHC: 31.4 g/dL (ref 30.0–36.0)
MCV: 91.8 fL (ref 78.0–100.0)
PLATELETS: 310 10*3/uL (ref 150–400)
RBC: 3.64 MIL/uL — ABNORMAL LOW (ref 3.87–5.11)
RDW: 14.9 % (ref 11.5–15.5)
WBC: 6.8 10*3/uL (ref 4.0–10.5)

## 2017-02-12 LAB — I-STAT TROPONIN, ED: Troponin i, poc: 0 ng/mL (ref 0.00–0.08)

## 2017-02-12 MED ORDER — IPRATROPIUM-ALBUTEROL 0.5-2.5 (3) MG/3ML IN SOLN
3.0000 mL | Freq: Once | RESPIRATORY_TRACT | Status: AC
  Administered 2017-02-12: 3 mL via RESPIRATORY_TRACT
  Filled 2017-02-12: qty 3

## 2017-02-12 MED ORDER — ONDANSETRON 4 MG PO TBDP
4.0000 mg | ORAL_TABLET | Freq: Once | ORAL | Status: AC
Start: 2017-02-12 — End: 2017-02-12
  Administered 2017-02-12: 4 mg via ORAL
  Filled 2017-02-12: qty 1

## 2017-02-12 MED ORDER — HYDROCODONE-ACETAMINOPHEN 5-325 MG PO TABS
1.0000 | ORAL_TABLET | Freq: Once | ORAL | Status: AC
  Administered 2017-02-12: 1 via ORAL
  Filled 2017-02-12: qty 1
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
## 2017-02-12 NOTE — ED Notes (Signed)
Pt states that she just wants to leave and is only waiting on her "strangulation test' and is happy to have this done anywhere, even in the hall.  Explained to her that we do not have any beds but that moving her is a top priority to me and I will check

## 2017-02-12 NOTE — ED Notes (Signed)
SANE RN at bedside.

## 2017-02-12 NOTE — ED Notes (Signed)
Pt appears in pain.  She has multiple bruises and areas of swelling.  She states that she just needs this "strangulation test" as she does not want to do anything wrong.  Asked pt if she needs to be a confidential patient for her safety. Pt declined.  Pt taken back to [facility]
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
## 2017-02-12 NOTE — Discharge Instructions (Addendum)
Interpersonal Violence   Interpersonal Violence aka Domestic Violence is defined as violence between people who have had a personal relationship. For example, someone you have ever dated, been married to or in a domestic partnership with. Someone with whom you have a child in common, or a current  household member.  Does one or more of the following   attempts to cause bodily injury, or intentionally causes bodily injury;  places you or a member of your family or household in fear of imminent serious bodily injury;  continued harassment that rises to such a level as to inflict substantial emotional distress; or  commits any rape or sexual offense  You are not alone. Unfortunately domestic violence is very common. Domestic violence does not go away on its own and tends to get worse over time and more frequent. There are people who can help. There are resources included in these instructions. Evidence can be collected in case you want to notify law enforcement now or in the future. A forensic nurse can take photographs and create a medical/legal document of the incident. If you choose to report to law enforcement, they will request a copy of the chart which we can provide with your permission. We can call in social work or an advocate to help with safety planning and emergency placement in a shelter if you have no other safe options.  THE POLICE CAN HELP YOU:   Get to a safe place away from the violence.   Get information on how the court can help protect you against the violence.   Get necessary belongings from your home for you and your children.   Get copies of police reports about the violence.   File a complaint in criminal court.   Find where local criminal and family courts are located.             [facility]  Obtaining a Landscape architect (50B)  Engineer, drilling  Support Group  Theatre manager with domestic violence related criminal charges  Child Public affairs consultant Assistance Enrollment  Job Readiness  Budget Counseling   Coaching and Mentoring  Call your local domestic violence program for additional information and support.   [facility]   336-641-SAFE Crisis Line [facility] [place]   [phone] Crisis Line [phone] Legal [facility] of [facility] [phone]  National Domestic Violence Abuse Hotline  407-625-8446     Soft Tissue Injury of the Neck (Strangulation)  A soft tissue injury of the neck is serious and needs medical care right away.  Some injuries do not break the skin (blunt injury).  Some injuries do break the skin (penetrating injury) and create an open wound.  You may feel fine at first, but the puffiness (swelling) in your throat can slowly make it harder to breathe.  This could cause serious or life-threatening injury.  There could be damage to major blood vessels and nerves in the neck.    Be sure to tell your health care provider how the injury occurred and if someone else caused the injury.  Also, tell the provider if any object or hands were used to cause the injury (such as rope, clothesline, telephone cord, etc).    Home Care Get help right away if:  Your voice gets weaker or hoarse  Your puffiness  or bruising does not get better  You have new puffiness or bruising in the face or neck  Your pain gets worse   You have trouble swallowing  You cough up blood  You have trouble breathing  You start to drool  You start throwing up (vomiting)  You have a fever of 102 degrees  Adapted from [facility] Patient Information
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
## 2017-02-12 NOTE — ED Notes (Signed)
Wounds/Staples: Left posterior forearm- 2 Left anterior forearm- 2 Left medial forearm-4  Right lateral hip-1 Right medial hip-1 Right distal medial thigh #1- 6 Right distal medial thigh # 2- 2 Right medial inferior thigh- 2 Right proximal/medial thigh- 2 Right lateral calf- 3.   25 staples in total upon assessment.

## 2017-02-13 MED ORDER — HYDROMORPHONE HCL 1 MG/ML IJ SOLN
0.5000 mg | Freq: Once | INTRAMUSCULAR | Status: AC
  Administered 2017-02-13: 0.5 mg via INTRAVENOUS
  Filled 2017-02-13: qty 0.5

## 2017-02-13 MED ORDER — DIPHENHYDRAMINE HCL 50 MG/ML IJ SOLN
25.0000 mg | Freq: Once | INTRAMUSCULAR | Status: AC
  Administered 2017-02-13: 25 mg via INTRAVENOUS
  Filled 2017-02-13: qty 1

## 2017-02-13 MED ORDER — AMPICILLIN-SULBACTAM SODIUM 3 (2-1) G IJ SOLR
3.0000 g | Freq: Once | INTRAMUSCULAR | Status: AC
  Administered 2017-02-13: 3 g via INTRAVENOUS
  Filled 2017-02-13: qty 3

## 2017-02-13 NOTE — SANE Note (Signed)
Follow-up Phone Call  Patient gives verbal consent for a FNE/SANE follow-up phone call in 48-72 hours: yes Patient's telephone number: [phone] Patient gives verbal consent to leave voicemail at the phone number listed above: yes DO NOT CALL between the hours of: in the morning

## 2017-02-14 ENCOUNTER — Ambulatory Visit (HOSPITAL_COMMUNITY)
Admission: EM | Admit: 2017-02-14 | Discharge: 2017-02-14 | Disposition: A | Discharge status: Home / Self Care | Payer: Managed Care, Other (non HMO) | Attending: Internal Medicine | Admitting: Internal Medicine

## 2017-02-14 ENCOUNTER — Encounter (HOSPITAL_COMMUNITY): Payer: Self-pay | Admitting: Emergency Medicine

## 2017-02-14 DIAGNOSIS — S21001A Unspecified open wound of right breast, initial encounter: Secondary | ICD-10-CM

## 2017-02-14 DIAGNOSIS — Z23 Encounter for immunization: Secondary | ICD-10-CM | POA: Diagnosis not present

## 2017-02-14 DIAGNOSIS — Z5189 Encounter for other specified aftercare: Secondary | ICD-10-CM

## 2017-02-14 DIAGNOSIS — S31815A Open bite of right buttock, initial encounter: Secondary | ICD-10-CM | POA: Diagnosis not present

## 2017-02-14 DIAGNOSIS — W540XXA Bitten by dog, initial encounter: Secondary | ICD-10-CM | POA: Diagnosis not present

## 2017-02-14 MED ORDER — RABIES VACCINE, PCEC IM SUSR
INTRAMUSCULAR | Status: AC
  Filled 2017-02-14: qty 1

## 2017-02-14 MED ORDER — HYDROCODONE-ACETAMINOPHEN 5-325 MG PO TABS
1.0000 | ORAL_TABLET | Freq: Once | ORAL | Status: AC
Start: 2017-02-14 — End: 2017-02-14
  Administered 2017-02-14: 1 via ORAL

## 2017-02-14 MED ORDER — HYDROCODONE-ACETAMINOPHEN 10-325 MG PO TABS: 1.0000 | ORAL_TABLET | Freq: Four times a day (QID) | ORAL | 0 refills | Status: AC | PRN

## 2017-02-14 MED ORDER — SULFAMETHOXAZOLE-TRIMETHOPRIM 800-160 MG PO TABS
1.0000 | ORAL_TABLET | Freq: Two times a day (BID) | ORAL | 0 refills | Status: AC
Start: 2017-02-14 — End: 2017-02-21

## 2017-02-14 MED ORDER — RABIES VACCINE, PCEC IM SUSR
1.0000 mL | Freq: Once | INTRAMUSCULAR | Status: AC
  Administered 2017-02-14: 1 mL via INTRAMUSCULAR

## 2017-02-14 MED ORDER — HYDROCODONE-ACETAMINOPHEN 5-325 MG PO TABS
ORAL_TABLET | ORAL | Status: AC
  Filled 2017-02-14: qty 1

## 2017-02-14 NOTE — ED Provider Notes (Signed)
CSN: [id number]     Arrival date & time 02/14/17  1005 History   None    Chief Complaint  Patient presents with  . Wound Check  . Rabies Injection   (Consider location/radiation/quality/duration/timing/severity/associated sxs/prior Treatment) Kelli Mayer is a 36 y.o. female with a past history of migranes, who presents to the [facility] with a chief complaint of wound check, and for rabies vaccine. Was attacked by a pit bull on 02/11/2017, went to the ER, had a follow-up visit on 02/12/2017. She was started on Augmentin, and hydrocodone for pain, she had multiple wounds on multiple locations on her body, with multiple staples placed. She isn't continued pain, but has no additional new complaints.    The history is provided by the patient.  Wound Check     Past Medical History:  Diagnosis Date  . Migraines   . Sleep apnea    Past Surgical History:  Procedure Laterality Date  . laprascopic    . ROOT CANAL    . WISDOM TOOTH EXTRACTION     No family history on file. Social History  Substance Use Topics  . Smoking status: Never Smoker  . Smokeless tobacco: Never Used  . Alcohol use Yes     Comment: occassionally   OB History    Gravida Para Term Preterm AB Living   3 0     3 0   SAB TAB Ectopic Multiple Live Births   3             Review of Systems  Constitutional: Negative for chills and fever.  HENT: Negative.   Respiratory: Negative.   Cardiovascular: Negative.   Gastrointestinal: Negative.   Musculoskeletal:       Pain from multiple areas  Skin: Positive for wound.  Neurological: Negative.     Allergies  Coconut fatty acids; Ibuprofen; Iron; Latex; Naproxen; and Oxycodone  Home Medications   Prior to Admission medications   Medication Sig Start Date End Date Taking? Authorizing Provider  acetaminophen (TYLENOL) 500 MG tablet Take 1,000 mg by mouth every 6 (six) hours as needed for moderate pain.    [provider]  albuterol  (PROVENTIL HFA;VENTOLIN HFA) 108 (90 Base) MCG/ACT inhaler Inhale 2 puffs into the lungs every 4 (four) hours as needed for wheezing or shortness of breath.    [provider]  amoxicillin-clavulanate (AUGMENTIN) 875-125 MG tablet Take 1 tablet by mouth 2 (two) times daily. One po bid x 7 days 02/11/17   Margarita Mail, PA-C  Cyanocobalamin (B-12 PO) Place 1 mL under the tongue as needed (For energy.).    [provider]  diphenhydrAMINE (BENADRYL) 25 MG tablet Take 25 mg by mouth every 6 (six) hours as needed for allergies.    [provider]  EPINEPHrine 0.3 mg/0.3 mL IJ SOAJ injection Inject 0.3 mg into the muscle as needed (allergic reaction).     [provider]  folic acid (FOLVITE) 1 MG tablet Take 1 tablet (1 mg total) by mouth daily. Patient not taking: Reported on 02/11/2017 12/20/16   Shelly Bombard, MD  HYDROcodone-acetaminophen [facility]) 10-325 MG tablet Take 1 tablet by mouth every 6 (six) hours as needed. 02/14/17   Barnet Glasgow, NP  ipratropium-albuterol (DUONEB) 0.5-2.5 (3) MG/3ML SOLN Take 3 mLs by nebulization every 4 (four) hours as needed (For asthma.).    [provider]  Ketotifen Fumarate (ALLERGY EYE DROPS OP) Place 2 drops into both eyes 4 (four) times daily  as needed (For allergies.).    [provider]  levonorgestrel (MIRENA) 20 MCG/24HR IUD 1 each by Intrauterine route once. Placed 11/21/15.    [provider]  metroNIDAZOLE (FLAGYL) 500 MG tablet Take 1 tablet (500 mg total) by mouth 2 (two) times daily. Patient not taking: Reported on 02/11/2017 12/24/16   Shelly Bombard, MD  Multiple Vitamins-Minerals (MULTIVITAMIN ADULT PO) Take 1 tablet by mouth 2 (two) times a week.    [provider]  norethindrone (AYGESTIN) 5 MG tablet Take 1 tablet (5 mg total) by mouth daily. Patient not taking: Reported on 12/20/2016 11/21/15   Seabron Spates, CNM  promethazine (PHENERGAN) 25 MG tablet Take 1 tablet (25 mg  total) by mouth every 6 (six) hours as needed for nausea or vomiting. 12/20/16   Shelly Bombard, MD  sulfamethoxazole-trimethoprim (BACTRIM DS,SEPTRA DS) 800-160 MG tablet Take 1 tablet by mouth 2 (two) times daily. 02/14/17 02/21/17  Barnet Glasgow, NP   Meds Ordered and Administered this Visit   Medications  rabies vaccine (RABAVERT) injection 1 mL (1 mL Intramuscular Given 02/14/17 1212)  HYDROcodone-acetaminophen (NORCO/VICODIN) 5-325 MG per tablet 1 tablet (1 tablet Oral Given 02/14/17 1211)    BP (!) 149/86   Pulse 88   Temp 98.7 F (37.1 C) (Oral)   Resp 16   Ht 5\' 2"  (1.575 m)   Wt 242 lb (109.8 kg)   SpO2 99%   BMI 44.26 kg/m  No data found.   Physical Exam  Constitutional: She is oriented to person, place, and time. She appears well-developed and well-nourished. She appears distressed.  HENT:  Head: Normocephalic.  Right Ear: External ear normal.  Left Ear: External ear normal.  Eyes: Conjunctivae are normal.  Neck: Normal range of motion.  Cardiovascular: Normal rate and regular rhythm.   Musculoskeletal: She exhibits tenderness.  Neurological: She is alert and oriented to person, place, and time.  Skin: Skin is warm and dry. Capillary refill takes less than 2 seconds. She is not diaphoretic.  Multiple wounds scattered throughout the entire body, there are bite marks on the lower left leg, upper left leg, buttocks, left and right arm, and back, as well as under the right breast. Wounds under the right breast to have some drainage, appears to be clear, without purulence, the remaining wounds do not show any signs or symptoms of infection.  Psychiatric: She has a normal mood and affect. Her behavior is normal.  Nursing note and vitals reviewed.   Urgent Care Course     Procedures (including critical care time)  Labs Review Labs Reviewed - No data to display  Imaging Review No results found.      MDM   1. Visit for wound check     Wound images from  her ER visit 02/11/2017 were reviewed along with the prior record from 02/12/2017. Wounds do appear to be well healing, without notable signs of infection, however there is some clear drainage from the wounds on her buttocks, and under the right breast.  Advised to continue the Augmentin as prescribed in the ER, also starting on Bactrim prophylactically, encouraged use of over-the-counter antibiotic ointment on the wounds to the breast, and buttocks, however and not the remaining wounds. Wound care guidelines were provided to the patient, and she was also continued on her hydrocodone for a few more days, along with providing a work note. Return to clinic at next scheduled visit for rabies vaccine and staple removal.    Dineen Kid,  [place]cell N[place], NP 02/14/17 1406

## 2017-02-14 NOTE — ED Triage Notes (Signed)
PT was attacked by two dogs Tuesday at 1pm. PT was seen in ED that day. PT developed a fever Wednesday and returned to ED and was given IV antibiotics and had some staples removed. PT reports fever yesterday as well. PT has dressed wounds to right waist, right leg, and left wrist. PT also has multiple bruises and bite marks.

## 2017-02-14 NOTE — Discharge Instructions (Signed)
Keep your wounds clean and dry as directed, inspect the wounds at least once daily. Apply an over the counter antibiotic ointment of your choice to the wounds under your breast and on your buttocks. Finish the Augmentin as prescribed. I have also started you on Bactrim to cover common skin infections, take 1 tablet twice a day. I have also prescribed a medicine for pain called hydrocodone, this medicine is a narcotic, it will cause drowsiness, and it is addictive. Do not take more than what is necessary, do not drink alcohol while taking, and do not operate any heavy machinery while taking this medicine. Return to clinic as needed.

## 2017-02-18 ENCOUNTER — Encounter (HOSPITAL_COMMUNITY): Payer: Self-pay | Admitting: Emergency Medicine

## 2017-02-18 ENCOUNTER — Ambulatory Visit (HOSPITAL_COMMUNITY)
Admission: EM | Admit: 2017-02-18 | Discharge: 2017-02-18 | Disposition: A | Discharge status: Home / Self Care | Payer: Managed Care, Other (non HMO) | Attending: Internal Medicine | Admitting: Internal Medicine

## 2017-02-18 DIAGNOSIS — Z203 Contact with and (suspected) exposure to rabies: Secondary | ICD-10-CM | POA: Diagnosis not present

## 2017-02-18 DIAGNOSIS — Z23 Encounter for immunization: Secondary | ICD-10-CM

## 2017-02-18 DIAGNOSIS — T148XXA Other injury of unspecified body region, initial encounter: Secondary | ICD-10-CM

## 2017-02-18 DIAGNOSIS — Z5189 Encounter for other specified aftercare: Secondary | ICD-10-CM

## 2017-02-18 DIAGNOSIS — Z4802 Encounter for removal of sutures: Secondary | ICD-10-CM | POA: Diagnosis not present

## 2017-02-18 DIAGNOSIS — L089 Local infection of the skin and subcutaneous tissue, unspecified: Secondary | ICD-10-CM

## 2017-02-18 DIAGNOSIS — S71101D Unspecified open wound, right thigh, subsequent encounter: Secondary | ICD-10-CM

## 2017-02-18 MED ORDER — RABIES VACCINE, PCEC IM SUSR
1.0000 mL | Freq: Once | INTRAMUSCULAR | Status: AC
  Administered 2017-02-18: 1 mL via INTRAMUSCULAR

## 2017-02-18 MED ORDER — RABIES VACCINE, PCEC IM SUSR
INTRAMUSCULAR | Status: AC
  Filled 2017-02-18: qty 1

## 2017-02-18 NOTE — ED Triage Notes (Signed)
The patient presented to the [facility] with a complaint of needing the Day 7 rabies booster injection as well as check of the initial wound.

## 2017-02-18 NOTE — Discharge Instructions (Signed)
° °  Please take antibiotics as prescribed and be sure to complete entire course even if you start to feel better to ensure infection does not come back.  Now that the staples have been removed, be gentle while your wounds continue to heal. You should change the bandage on your Right thigh at least twice daily.  Do NOT soak in water.   You may cover the smaller wounds with small bandages if you apply neosporin but do not apply ointment over steri-strips as this might make them fall off sooner than intended.

## 2017-02-18 NOTE — ED Provider Notes (Addendum)
CSN: [id number]     Arrival date & time 02/18/17  1002 History   First MD Initiated Contact with Patient 02/18/17 1030     Chief Complaint  Patient presents with  . Rabies Injection  . Wound Check   (Consider location/radiation/quality/duration/timing/severity/associated sxs/prior Treatment) HPI  Kelli Mayer is a 36 y.o. female presenting to UC for Day 7 rabies vaccine as well as requesting a wound recheck.  Pt is most concerned about wound on Right thigh as it is still red, hot, and painful.  She notes she is suppose to go back to work on Saturday July 7th but her current work note has her returning 02/17/17.  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 She is concerned she will not be able to perform her job by Saturday due to severe pain in Right leg.  Denies fever or chills.  She notes redness has slowly improved. She is still taking Augmentin and Bactrim as prescribed.   Per last note from UC, pt to have wound checked for staple removal today.   Past Medical History:  Diagnosis Date  . Migraines   . Sleep apnea    Past Surgical History:  Procedure Laterality Date  . laprascopic    . ROOT CANAL    . WISDOM TOOTH EXTRACTION     History reviewed. No pertinent family history. Social History  Substance Use Topics  . Smoking status: Never Smoker  . Smokeless tobacco: Never Used  . Alcohol use Yes     Comment: occassionally   OB History    Gravida Para Term Preterm AB Living   3 0     3 0   SAB TAB Ectopic Multiple Live Births   3             Review of Systems  Constitutional: Negative for chills and fever.  Gastrointestinal: Negative for diarrhea, nausea and vomiting.  Musculoskeletal: Positive for myalgias (Right thigh). Negative for arthralgias, back pain and joint swelling.  Skin: Positive for color change and wound.  Neurological: Negative for weakness and numbness.    Allergies  Coconut fatty  acids; Ibuprofen; Iron; Latex; Naproxen; and Oxycodone  Home Medications   Prior to Admission medications   Medication Sig Start Date End Date Taking? Authorizing Provider  acetaminophen (TYLENOL) 500 MG tablet Take 1,000 mg by mouth every 6 (six) hours as needed for moderate pain.    [provider]  albuterol (PROVENTIL HFA;VENTOLIN HFA) 108 (90 Base) MCG/ACT inhaler Inhale 2 puffs into the lungs every 4 (four) hours as needed for wheezing or shortness of breath.    [provider]  amoxicillin-clavulanate (AUGMENTIN) 875-125 MG tablet Take 1 tablet by mouth 2 (two) times daily. One po bid x 7 days 02/11/17   Margarita Mail, PA-C  Cyanocobalamin (B-12 PO) Place 1 mL under the tongue as needed (For energy.).    [provider]  diphenhydrAMINE (BENADRYL) 25 MG tablet Take 25 mg by mouth every 6 (six) hours as needed for allergies.    [provider]  EPINEPHrine 0.3 mg/0.3 mL IJ SOAJ injection Inject 0.3 mg into the muscle as needed (allergic reaction).     [provider]  folic acid (FOLVITE) 1 MG tablet Take 1 tablet (1 mg total) by mouth daily. Patient not taking: Reported on 02/11/2017 12/20/16   Shelly Bombard, MD  HYDROcodone-acetaminophen [facility]) 10-325 MG tablet Take  1 tablet by mouth every 6 (six) hours as needed. 02/14/17   Barnet Glasgow, NP  ipratropium-albuterol (DUONEB) 0.5-2.5 (3) MG/3ML SOLN Take 3 mLs by nebulization every 4 (four) hours as needed (For asthma.).    [provider]  Ketotifen Fumarate (ALLERGY EYE DROPS OP) Place 2 drops into both eyes 4 (four) times daily as needed (For allergies.).    [provider]  levonorgestrel (MIRENA) 20 MCG/24HR IUD 1 each by Intrauterine route once. Placed 11/21/15.    [provider]  metroNIDAZOLE (FLAGYL) 500 MG tablet Take 1 tablet (500 mg total) by mouth 2 (two) times daily. Patient not taking: Reported on 02/11/2017 12/24/16   Shelly Bombard, MD  Multiple  Vitamins-Minerals (MULTIVITAMIN ADULT PO) Take 1 tablet by mouth 2 (two) times a week.    [provider]  norethindrone (AYGESTIN) 5 MG tablet Take 1 tablet (5 mg total) by mouth daily. Patient not taking: Reported on 12/20/2016 11/21/15   Seabron Spates, CNM  promethazine (PHENERGAN) 25 MG tablet Take 1 tablet (25 mg total) by mouth every 6 (six) hours as needed for nausea or vomiting. 12/20/16   Shelly Bombard, MD  sulfamethoxazole-trimethoprim (BACTRIM DS,SEPTRA DS) 800-160 MG tablet Take 1 tablet by mouth 2 (two) times daily. 02/14/17 02/21/17  Barnet Glasgow, NP   Meds Ordered and Administered this Visit   Medications  rabies vaccine (RABAVERT) injection 1 mL (1 mL Intramuscular Given 02/18/17 1042)    BP 118/88 (BP Location: Right Arm)   Pulse (!) 101   Temp 98.3 F (36.8 C) (Oral)   Resp 16   SpO2 98%  No data found.   Physical Exam  Constitutional: She is oriented to person, place, and time. She appears well-developed and well-nourished.  HENT:  Head: Normocephalic.  Well healing ecchymosis around both eyes  Eyes: EOM are normal.  Neck: Normal range of motion.  Cardiovascular: Normal rate.   Pulmonary/Chest: Effort normal.  Musculoskeletal: Normal range of motion. She exhibits edema and tenderness.  Right medial lower thigh: mild edema, tender (see skin exam)  full ROM upper and lower extremities.   Neurological: She is alert and oriented to person, place, and time.  Skin: Skin is warm and dry.     Multiple well healing lacerations to Left forearm, Right hip, and Right leg with multiple staples.  Right thigh- 2 large lacerations with staples still in place. Erythema, warmth, edema, and tenderness to Right medial lower light. Scant clear-bloody drainage.   Psychiatric: She has a normal mood and affect. Her behavior is normal.  Nursing note and vitals reviewed.   Urgent Care Course     .Suture Removal Date/Time: 02/18/2017 11:58 AM Performed by: Noe Gens Authorized by: Sherlene Shams   Consent:    Consent obtained:  Verbal   Consent given by:  Patient   Risks discussed:  Bleeding, pain and wound separation   Alternatives discussed:  Delayed treatment Location:    Location:  Lower extremity   Lower extremity location:  Leg   Leg location:  R upper leg Procedure details:    Wound appearance:  Red, warm and draining ( appears infected- gradually improved based on body markers from 6/29.)   Drainage characteristics:  Scant drainage of clear-bloody discharge   Number of staples removed:  4 Post-procedure details:    Post-removal:  Steri-Strips applied and dressing applied   Patient tolerance of procedure:  Tolerated well, no immediate complications .Suture Removal Date/Time: 02/18/2017 11:59 AM  Performed by: Noe Gens Authorized by: Sherlene Shams   Consent:    Consent obtained:  Verbal   Consent given by:  Patient   Risks discussed:  Pain, bleeding and wound separation   Alternatives discussed:  Delayed treatment Location:    Location:  Lower extremity   Lower extremity location:  Leg   Leg location:  R upper leg Procedure details:    Wound appearance:  Draining, red and warm   Drainage characteristics:  Scant clear   Number of staples removed:  2 Post-procedure details:    Post-removal:  Steri-Strips applied and dressing applied   Patient tolerance of procedure:  Tolerated well, no immediate complications .Suture Removal Date/Time: 02/18/2017 12:00 PM Performed by: Noe Gens Authorized by: Sherlene Shams   Consent:    Consent obtained:  Verbal   Consent given by:  Patient   Risks discussed:  Pain, wound separation and bleeding   Alternatives discussed:  Delayed treatment Location:    Location:  Trunk   Trunk location:  Flank   Flank location:  R flank Procedure details:    Wound appearance:  No signs of infection and good wound healing (small scab formation. no surrounding erythema., edmea or  warmth. no drainage)   Number of staples removed:  2 Post-procedure details:    Post-removal:  Antibiotic ointment applied and dressing applied   Patient tolerance of procedure:  Tolerated well, no immediate complications .Suture Removal Date/Time: 02/18/2017 12:01 PM Performed by: Noe Gens Authorized by: Sherlene Shams   Consent:    Consent obtained:  Verbal   Consent given by:  Patient   Risks discussed:  Bleeding, pain and wound separation   Alternatives discussed:  Delayed treatment Location:    Location:  Lower extremity   Lower extremity location:  Leg   Leg location:  R upper leg Procedure details:    Wound appearance:  No signs of infection and good wound healing   Number of staples removed:  1 Post-procedure details:    Post-removal:  Steri-Strips applied and dressing applied   Patient tolerance of procedure:  Tolerated well, no immediate complications .Suture Removal Date/Time: 02/18/2017 12:02 PM Performed by: Noe Gens Authorized by: Sherlene Shams   Consent:    Consent obtained:  Verbal   Consent given by:  Patient   Risks discussed:  Pain, bleeding and wound separation   Alternatives discussed:  Delayed treatment Location:    Location:  Lower extremity   Lower extremity location:  Leg   Leg location:  R lower leg Procedure details:    Wound appearance:  No signs of infection, good wound healing and draining   Drainage characteristics:  Scant red blood after staple removal.   Number of staples removed:  3 Post-procedure details:    Post-removal:  Steri-Strips applied and dressing applied   Patient tolerance of procedure:  Tolerated well, no immediate complications .Suture Removal Date/Time: 02/18/2017 12:03 PM Performed by: Noe Gens Authorized by: Sherlene Shams   Consent:    Consent obtained:  Verbal   Consent given by:  Patient   Risks discussed:  Bleeding, pain and wound separation   Alternatives discussed:  Delayed  treatment Location:    Location:  Upper extremity   Upper extremity location:  Arm   Arm location:  L lower arm Procedure details:    Wound appearance:  No signs of infection, good wound healing and clean (small scab formation, no erythema or  drainage)   Number of staples removed:  2 Post-procedure details:    Post-removal:  Antibiotic ointment applied and dressing applied   Patient tolerance of procedure:  Tolerated well, no immediate complications .Suture Removal Date/Time: 02/18/2017 12:04 PM Performed by: Noe Gens Authorized by: Sherlene Shams   Consent:    Consent obtained:  Verbal   Consent given by:  Patient   Risks discussed:  Bleeding, pain and wound separation   Alternatives discussed:  Delayed treatment Location:    Location:  Upper extremity   Upper extremity location:  Arm   Arm location:  L lower arm Procedure details:    Wound appearance:  No signs of infection and good wound healing (small scab formation. no bleeding or discharge. no ertyhema.)   Number of staples removed:  2 Post-procedure details:    Post-removal:  Steri-Strips applied and dressing applied   Patient tolerance of procedure:  Tolerated well, no immediate complications .Suture Removal Date/Time: 02/18/2017 12:04 PM Performed by: Noe Gens Authorized by: Sherlene Shams   Consent:    Consent obtained:  Verbal   Consent given by:  Patient   Risks discussed:  Bleeding, pain and wound separation   Alternatives discussed:  Delayed treatment Location:    Location:  Upper extremity   Upper extremity location:  Arm   Arm location:  L lower arm Procedure details:    Wound appearance:  No signs of infection and good wound healing (scab formatio. no erythema. scant red blood drainage after staple removal on 1st half)   Number of staples removed:  4 Post-procedure details:    Post-removal:  Steri-Strips applied and dressing applied   Patient tolerance of procedure:  Tolerated well, no  immediate complications   (including critical care time)  Labs Review Labs Reviewed - No data to display  Imaging Review No results found.   MDM   1. Contact with or exposure to rabies   2. Visit for wound check   3. Wound infection   4. Encounter for staple removal    Pt presenting to UC for her Day 7 rabies vaccine as well as wound recheck.  Prior medical records reviewed.  Wounds on Right thigh still appear infected and cellulitic, however, per markings on leg from prior visits, erythema appears to be improving.  Area is still erythematous, warm and tender.    Remaining staples removed to aid in wound healing. Steri-strips applied.  All staples removed as smaller wounds have healed well and scabbing over.  Larger wounds, staples removed due to healing well but scab formation.  Steri-strips and bandages applied.  STRONGLY encouraged pt return by Friday, July 6th for wound recheck of Right thigh at [facility] or with her Family Doctor, sooner if Right thigh worsening again. Complete both antibiotics- Augmentin and Bactrim.  Pt adamant she needs a work note for the weekend in order to find coverage in time. Work note provided until Monday 7/9 where pt should be able to return to work with light duty.  Home care instructions provided. Encouraged to change bandages at least twice daily, especially on Right thigh.    Noe Gens, PA-C 02/18/17 1214    [address], [place] 02/18/17 1330

## 2017-02-26 ENCOUNTER — Encounter (HOSPITAL_COMMUNITY): Payer: Self-pay | Admitting: Family Medicine

## 2017-02-26 ENCOUNTER — Ambulatory Visit (HOSPITAL_COMMUNITY)
Admission: EM | Admit: 2017-02-26 | Discharge: 2017-02-26 | Disposition: A | Discharge status: Home / Self Care | Payer: Managed Care, Other (non HMO) | Attending: Internal Medicine | Admitting: Internal Medicine

## 2017-02-26 DIAGNOSIS — Z203 Contact with and (suspected) exposure to rabies: Secondary | ICD-10-CM

## 2017-02-26 DIAGNOSIS — Z23 Encounter for immunization: Secondary | ICD-10-CM

## 2017-02-26 MED ORDER — RABIES VACCINE, PCEC IM SUSR: 1.0000 mL | Freq: Once | INTRAMUSCULAR | Status: DC

## 2017-02-26 MED ORDER — RABIES VACCINE, PCEC IM SUSR
INTRAMUSCULAR | Status: AC
  Filled 2017-02-26: qty 1

## 2017-02-26 MED ORDER — RABIES VACCINE, PCEC IM SUSR
1.0000 mL | Freq: Once | INTRAMUSCULAR | Status: AC
  Administered 2017-02-26: 1 mL via INTRAMUSCULAR

## 2017-02-26 NOTE — Discharge Instructions (Signed)
Today was your final rabies booster injection. Please return to the Urgent [facility] with any further issues with the rabies vaccination series.

## 2017-02-26 NOTE — ED Triage Notes (Signed)
Pt here for rabies shot.  

## 2017-04-08 ENCOUNTER — Encounter (HOSPITAL_COMMUNITY): Payer: Self-pay | Admitting: Emergency Medicine

## 2017-04-08 ENCOUNTER — Ambulatory Visit (HOSPITAL_COMMUNITY)
Admission: EM | Admit: 2017-04-08 | Discharge: 2017-04-08 | Disposition: A | Discharge status: Home / Self Care | Payer: Managed Care, Other (non HMO) | Attending: Family Medicine | Admitting: Family Medicine

## 2017-04-08 DIAGNOSIS — W25XXXA Contact with sharp glass, initial encounter: Secondary | ICD-10-CM | POA: Diagnosis not present

## 2017-04-08 DIAGNOSIS — S61511A Laceration without foreign body of right wrist, initial encounter: Secondary | ICD-10-CM

## 2017-04-08 DIAGNOSIS — S61512A Laceration without foreign body of left wrist, initial encounter: Secondary | ICD-10-CM

## 2017-04-08 NOTE — ED Provider Notes (Signed)
[place]    CSN: [id number] Arrival date & time: 04/08/17  1358     History   Chief Complaint Chief Complaint  Patient presents with  . Extremity Laceration    HPI Kelli Mayer is a 36 y.o. female presenting for lacerations of bilateral wrists.   She works nights as a Statistician at [employer] and states she did not get a lunch break last night, so when she got home she was frying bacon and went to open a window which was difficult to open. She pressed her hands on the window pane and it shattered, resulting in small lacerations on the right volar distal forearm/wrist and a longer laceration on the left ulnar wrist which she wrapped in gauze and coban and proceeded to urgent care. She has severe, constant pain in the wrists that does not radiate. Has taken nothing for this. She got lightheaded after the bleeding, which has improved.    Tetanus is UTD.   HPI  Past Medical History:  Diagnosis Date  . Migraines   . Sleep apnea     There are no active problems to display for this patient.   Past Surgical History:  Procedure Laterality Date  . laprascopic    . ROOT CANAL    . WISDOM TOOTH EXTRACTION      OB History    Gravida Para Term Preterm AB Living   3 0     3 0   SAB TAB Ectopic Multiple Live Births   3               Home Medications    Prior to Admission medications   Medication Sig Start Date End Date Taking? Authorizing Provider  acetaminophen (TYLENOL) 500 MG tablet Take 1,000 mg by mouth every 6 (six) hours as needed for moderate pain.    [provider]  albuterol (PROVENTIL HFA;VENTOLIN HFA) 108 (90 Base) MCG/ACT inhaler Inhale 2 puffs into the lungs every 4 (four) hours as needed for wheezing or shortness of breath.    [provider]  Cyanocobalamin (B-12 PO) Place 1 mL under the tongue as needed (For energy.).    [provider]  diphenhydrAMINE (BENADRYL) 25 MG tablet Take 25 mg by mouth  every 6 (six) hours as needed for allergies.    [provider]  EPINEPHrine 0.3 mg/0.3 mL IJ SOAJ injection Inject 0.3 mg into the muscle as needed (allergic reaction).     [provider]  HYDROcodone-acetaminophen (NORCO) 10-325 MG tablet Take 1 tablet by mouth every 6 (six) hours as needed. 02/14/17   Barnet Glasgow, NP  ipratropium-albuterol (DUONEB) 0.5-2.5 (3) MG/3ML SOLN Take 3 mLs by nebulization every 4 (four) hours as needed (For asthma.).    [provider]  Ketotifen Fumarate (ALLERGY EYE DROPS OP) Place 2 drops into both eyes 4 (four) times daily as needed (For allergies.).    [provider]  levonorgestrel (MIRENA) 20 MCG/24HR IUD 1 each by Intrauterine route once. Placed 11/21/15.    [provider]  Multiple Vitamins-Minerals (MULTIVITAMIN ADULT PO) Take 1 tablet by mouth 2 (two) times a week.    [provider]  norethindrone (AYGESTIN) 5 MG tablet Take 1 tablet (5 mg total) by mouth daily. Patient not taking: Reported on 12/20/2016 11/21/15   Seabron Spates, CNM  promethazine (PHENERGAN) 25 MG tablet Take 1 tablet (25 mg total) by mouth every 6 (six) hours as needed for nausea or vomiting. 12/20/16  Shelly Bombard, MD    Family History No family history on file.  Social History Social History  Substance Use Topics  . Smoking status: Never Smoker  . Smokeless tobacco: Never Used  . Alcohol use Yes     Comment: occassionally     Allergies   Coconut fatty acids; Ibuprofen; Iron; Latex; Naproxen; and Oxycodone   Review of Systems Review of Systems As above.   Physical Exam Triage Vital Signs ED Triage Vitals  Enc Vitals Group     BP 04/08/17 1411 118/70     Pulse Rate 04/08/17 1410 (!) 113     Resp 04/08/17 1410 20     Temp 04/08/17 1410 99.5 F (37.5 C)     Temp Source 04/08/17 1410 Oral     SpO2 04/08/17 1410 99 %     Weight --      Height --      Head Circumference --      Peak Flow --      Pain  Score 04/08/17 1412 10     Pain Loc --      Pain Edu? --      Excl. in [place]? --    No data found.   Updated Vital Signs BP 118/70   Pulse (!) 113   Temp 99.5 F (37.5 C) (Oral)   Resp 20   SpO2 99%    Physical Exam  Constitutional: She is oriented to person, place, and time. She appears well-developed and well-nourished. No distress.  Cardiovascular: Normal rate and regular rhythm.   No murmur heard. Pulmonary/Chest: Effort normal. No respiratory distress.  Neurological: She is alert and oriented to person, place, and time. No sensory deficit.  Skin: Skin is warm and dry.  Right wrist with several subcentimeter superficial linear lacerations, hemostatic without foreign bodies. Full AROM at wrist and hand/fingers, cap refill brisk, sensation and motor function normal.   Left wrist with ~1 inch linear transverse laceration to distal ulnar volar forearm. Superficial, no foreign body.   Psychiatric:  Delayed processing, but alert and displaying goal-directed thinking. ?inebriation. No evidence of mania. No SI or HI.  Nursing note and vitals reviewed.    UC Treatments / Results  Labs (all labs ordered are listed, but only abnormal results are displayed) Labs Reviewed - No data to display  EKG  EKG Interpretation None       Radiology No results found.  Procedures Procedures (including critical care time)  Medications Ordered in UC Medications - No data to display   Initial Impression / Assessment and Plan / UC Course  I have reviewed the triage vital signs and the nursing notes.  Pertinent labs & imaging results that were available during my care of the patient were reviewed by me and considered in my medical decision making (see chart for details).   Final Clinical Impressions(s) / UC Diagnoses   Final diagnoses:  Wrist laceration, left, initial encounter  Wrist laceration, right, initial encounter   36 y.o. female with bilateral wrist lacerations, very  superficial, neurovascularly intact. Local wound care advised. Of note, patient's affect is abnormal without evidence of mood disorder. Wounds are not consistent with intentional self harm.   New Prescriptions Current Discharge Medication List       Patrecia Pour, MD 04/08/17 1534

## 2017-04-08 NOTE — ED Triage Notes (Signed)
PT reports she was bit by a dog 6/26 and has old wounds to wrists.  PT reports she went to open the kitchen window this afternoon and the glass broke and lacerated both wrists. PT's mother wrapped areas in coban. PT describes bleeding as though it may be arterial. No bleeding through bandages at this time. PT reports she is lightheaded.

## 2017-04-08 NOTE — ED Notes (Signed)
Patient continued to have questions about wound and asked about work note, asked about butterfly tapes, patient stressed how her job might impact her wounds.  Offered to have her speak to provider.  Notified provider

## 2017-04-08 NOTE — ED Notes (Addendum)
Patient came out of treatment room with her belongings.  Patient states she was tired and was leaving.  Patient said she appreciated the time, but she had to go.  Reminded her provider was coming back to her room to discuss her questions/concerns.  Patient said she has supplies at home and planned to get in touch with her pcp for further evaluation.  Repeatedly said she appreciated our time.  Notified provider.

## 2017-12-22 ENCOUNTER — Ambulatory Visit: Payer: Managed Care, Other (non HMO) | Admitting: Obstetrics

## 2018-02-02 ENCOUNTER — Other Ambulatory Visit: Payer: Self-pay | Admitting: Obstetrics

## 2018-02-02 DIAGNOSIS — R110 Nausea: Secondary | ICD-10-CM

## 2019-02-14 IMAGING — CT CT CERVICAL SPINE W/O CM
4 of 8 series · 13 of 33 positions shown, 14 images · non-contrast
Comparison: None.

CLINICAL DATA: 36-year-old female with head and neck injury
following fall today. Initial encounter.

EXAM:
CT HEAD WITHOUT CONTRAST
CT CERVICAL SPINE WITHOUT CONTRAST
TECHNIQUE: Multidetector CT imaging of the head and cervical spine was
performed following the standard protocol without intravenous
contrast. Multiplanar CT image reconstructions of the cervical spine
were also generated.

[Series 6: c-spine st · axial · 0.24mm/px · z∈[-338,-288]mm · 2 of 76 slices shown]
[im 26/76  bone]
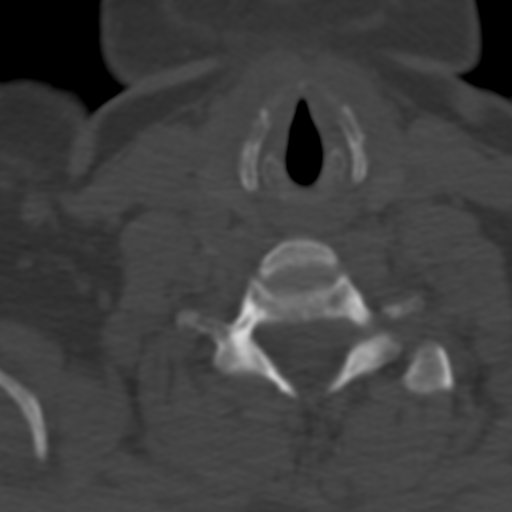
[im 51/76  bone]
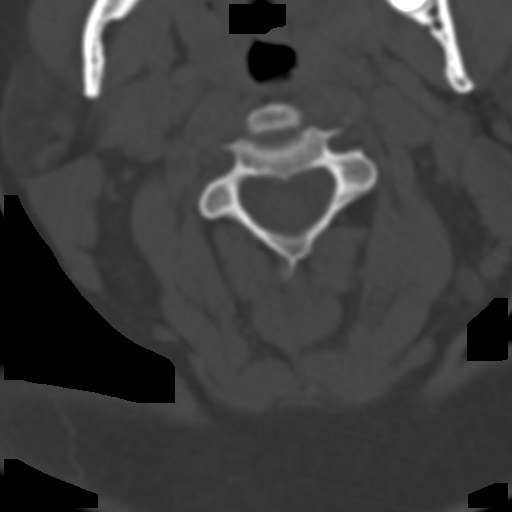

[Series 9: coronal · coronal · 0.28mm/px · 3 of 74 slices shown]
[im 19/74  bone]
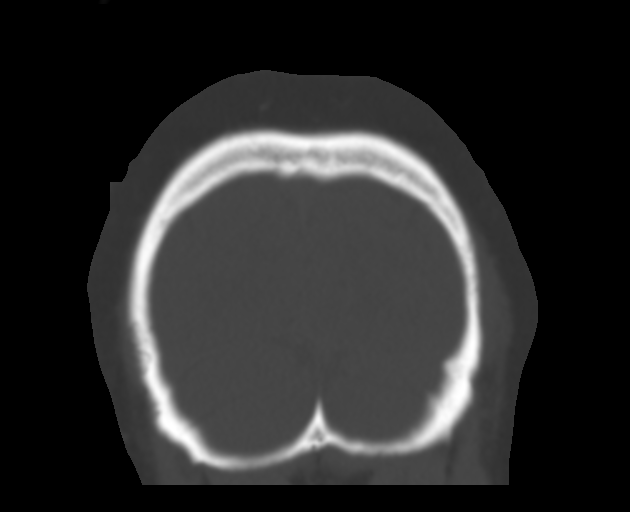
[im 37/74  bone]
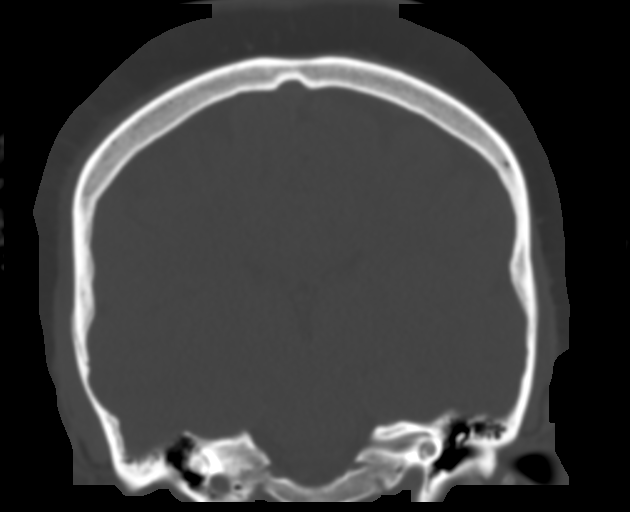
[im 55/74  bone]
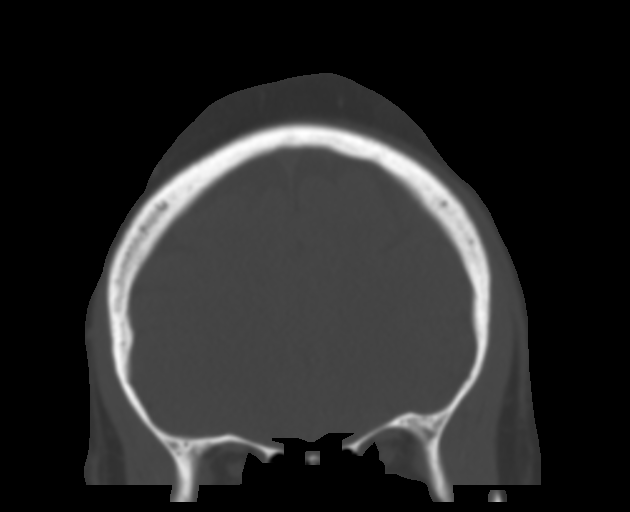

[Series 11: axial recon · axial · 0.23mm/px · z∈[-370,-284]mm · 3 of 92 slices shown, 4 images]
[im 23/92  soft-tissue]
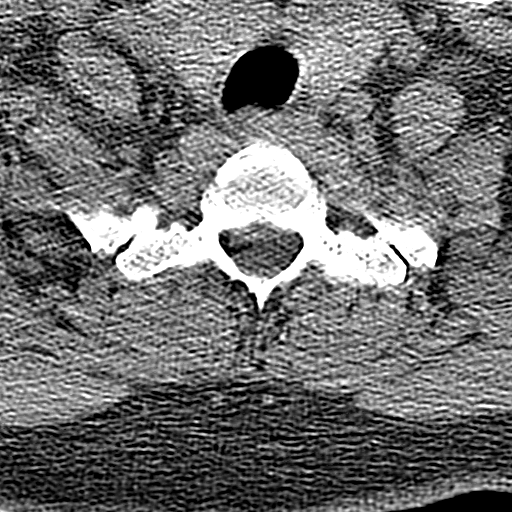
[im 23/92  bone]
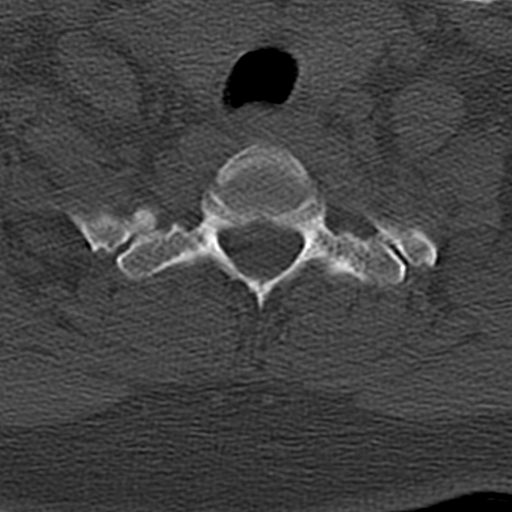
[im 46/92  bone]
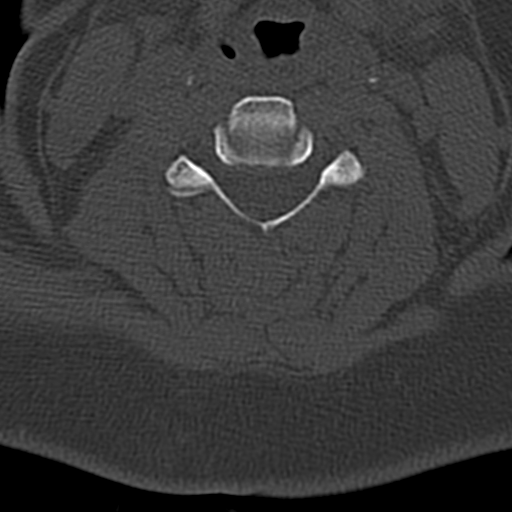
[im 69/92  bone]
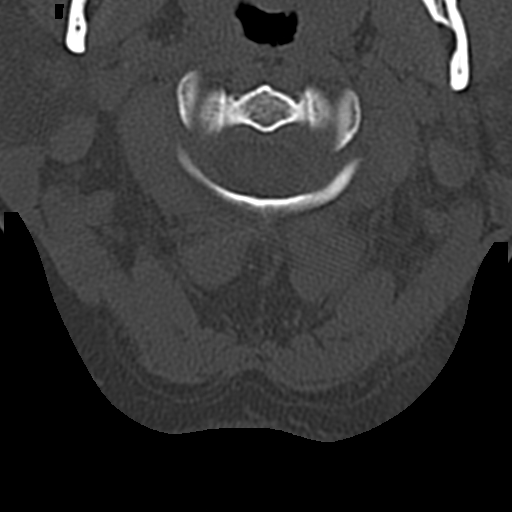

[Series 13: sagittal · sagittal · 0.30mm/px · 5 of 61 slices shown]
[im 11/61  bone]
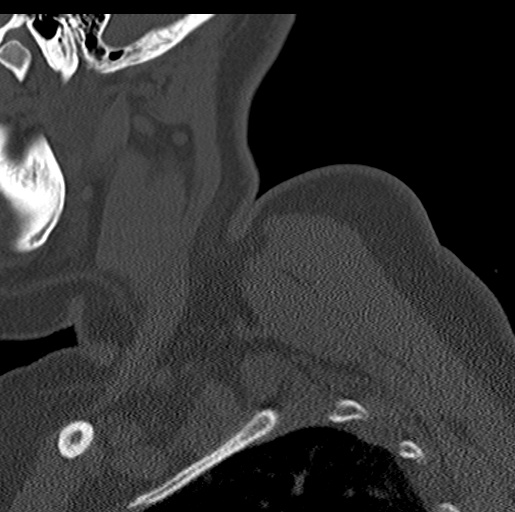
[im 21/61  bone]
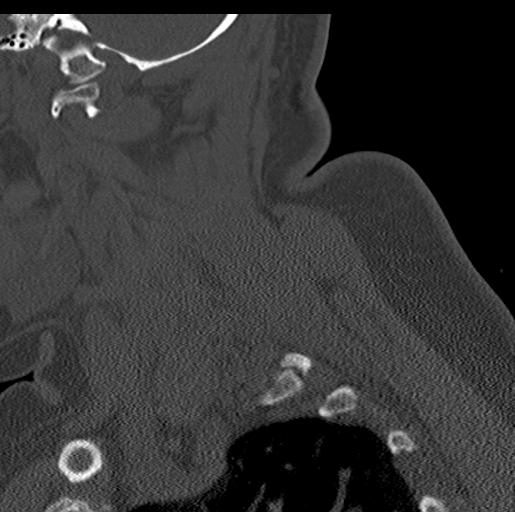
[im 31/61  bone]
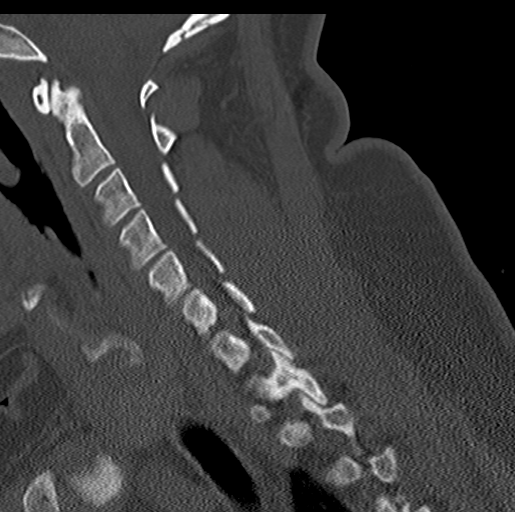
[im 41/61  bone]
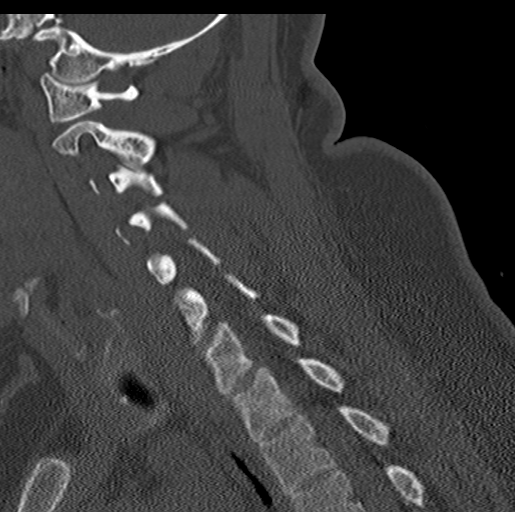
[im 51/61  bone]
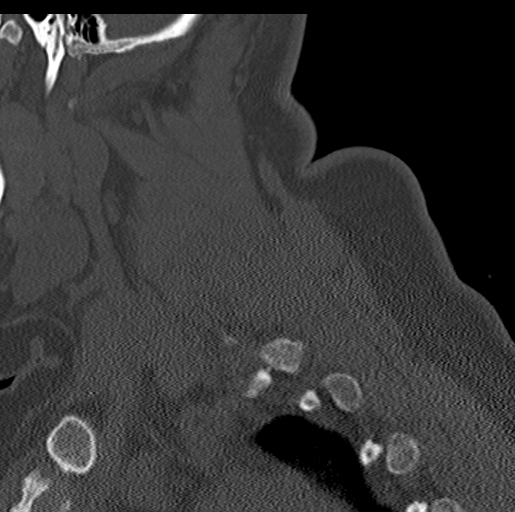

[13 of 33 positions shown; findings below may reference images not displayed]

FINDINGS: CT HEAD FINDINGS

Brain: No evidence of acute infarction, hemorrhage, hydrocephalus,
extra-axial collection or mass lesion/mass effect.

Vascular: No hyperdense vessel or unexpected calcification.

Skull: Normal. Negative for fracture or focal lesion.

Sinuses/Orbits: No acute finding.

Other: Forehead soft tissue swelling is noted.

CT CERVICAL SPINE FINDINGS

Alignment: Normal.

Skull base and vertebrae: No acute fracture. No primary bone lesion
or focal pathologic process.

Soft tissues and spinal canal: No prevertebral fluid or swelling. No
visible canal hematoma.

Disc levels:  Unremarkable

Upper chest: Negative.

Other: None
IMPRESSION: No evidence of acute intracranial abnormality.

Forehead soft tissue swelling without fracture.

No static evidence of acute injury to the cervical spine.
